# Patient Record
Sex: Male | Born: 1955 | Race: White | Hispanic: No | Marital: Single | Smoking: Former smoker
Health system: Southern US, Community
[De-identification: ages and names within clinical notes are randomized; demographics above are authoritative.]

## PROBLEM LIST (undated history)

## (undated) DIAGNOSIS — E669 Obesity, unspecified: Secondary | ICD-10-CM

## (undated) DIAGNOSIS — I219 Acute myocardial infarction, unspecified: Secondary | ICD-10-CM

## (undated) DIAGNOSIS — I10 Essential (primary) hypertension: Secondary | ICD-10-CM

## (undated) DIAGNOSIS — I509 Heart failure, unspecified: Secondary | ICD-10-CM

## (undated) DIAGNOSIS — B1920 Unspecified viral hepatitis C without hepatic coma: Secondary | ICD-10-CM

## (undated) DIAGNOSIS — I2510 Atherosclerotic heart disease of native coronary artery without angina pectoris: Secondary | ICD-10-CM

## (undated) DIAGNOSIS — E785 Hyperlipidemia, unspecified: Secondary | ICD-10-CM

## (undated) HISTORY — PX: HERNIA REPAIR: SHX51

## (undated) HISTORY — PX: LUMBAR LAMINECTOMY: SHX95

## (undated) HISTORY — PX: APPENDECTOMY: SHX54

## (undated) HISTORY — PX: CERVICAL LAMINECTOMY: SHX94

## (undated) HISTORY — PX: CHOLECYSTECTOMY: SHX55

## (undated) HISTORY — PX: REPLACEMENT TOTAL KNEE: SUR1224

---

## 2007-03-16 HISTORY — PX: CORONARY ARTERY BYPASS GRAFT: SHX141

## 2013-03-15 HISTORY — PX: CORONARY STENT PLACEMENT: SHX1402

## 2015-12-20 ENCOUNTER — Encounter (HOSPITAL_COMMUNITY): Payer: Self-pay

## 2015-12-20 ENCOUNTER — Inpatient Hospital Stay (HOSPITAL_COMMUNITY): Admission: EM | Admit: 2015-12-20 | Discharge: 2015-12-23 | DRG: 287 | Attending: Cardiology | Admitting: Cardiology

## 2015-12-20 ENCOUNTER — Emergency Department (HOSPITAL_COMMUNITY)

## 2015-12-20 DIAGNOSIS — Z23 Encounter for immunization: Secondary | ICD-10-CM

## 2015-12-20 DIAGNOSIS — I200 Unstable angina: Secondary | ICD-10-CM | POA: Diagnosis present

## 2015-12-20 DIAGNOSIS — I25110 Atherosclerotic heart disease of native coronary artery with unstable angina pectoris: Secondary | ICD-10-CM | POA: Diagnosis present

## 2015-12-20 DIAGNOSIS — Z6837 Body mass index (BMI) 37.0-37.9, adult: Secondary | ICD-10-CM | POA: Diagnosis not present

## 2015-12-20 DIAGNOSIS — Z87891 Personal history of nicotine dependence: Secondary | ICD-10-CM

## 2015-12-20 DIAGNOSIS — B1920 Unspecified viral hepatitis C without hepatic coma: Secondary | ICD-10-CM | POA: Diagnosis not present

## 2015-12-20 DIAGNOSIS — I2089 Other forms of angina pectoris: Secondary | ICD-10-CM | POA: Diagnosis present

## 2015-12-20 DIAGNOSIS — R61 Generalized hyperhidrosis: Secondary | ICD-10-CM | POA: Diagnosis present

## 2015-12-20 DIAGNOSIS — I252 Old myocardial infarction: Secondary | ICD-10-CM | POA: Diagnosis not present

## 2015-12-20 DIAGNOSIS — I10 Essential (primary) hypertension: Secondary | ICD-10-CM | POA: Diagnosis present

## 2015-12-20 DIAGNOSIS — Z7902 Long term (current) use of antithrombotics/antiplatelets: Secondary | ICD-10-CM

## 2015-12-20 DIAGNOSIS — M545 Low back pain: Secondary | ICD-10-CM | POA: Diagnosis present

## 2015-12-20 DIAGNOSIS — I208 Other forms of angina pectoris: Secondary | ICD-10-CM | POA: Diagnosis present

## 2015-12-20 DIAGNOSIS — Z9049 Acquired absence of other specified parts of digestive tract: Secondary | ICD-10-CM | POA: Diagnosis not present

## 2015-12-20 DIAGNOSIS — E785 Hyperlipidemia, unspecified: Secondary | ICD-10-CM | POA: Diagnosis not present

## 2015-12-20 DIAGNOSIS — I110 Hypertensive heart disease with heart failure: Secondary | ICD-10-CM | POA: Diagnosis present

## 2015-12-20 DIAGNOSIS — I509 Heart failure, unspecified: Secondary | ICD-10-CM

## 2015-12-20 DIAGNOSIS — Z96651 Presence of right artificial knee joint: Secondary | ICD-10-CM | POA: Diagnosis present

## 2015-12-20 DIAGNOSIS — I2510 Atherosclerotic heart disease of native coronary artery without angina pectoris: Secondary | ICD-10-CM | POA: Diagnosis present

## 2015-12-20 DIAGNOSIS — Z951 Presence of aortocoronary bypass graft: Secondary | ICD-10-CM | POA: Diagnosis not present

## 2015-12-20 DIAGNOSIS — E669 Obesity, unspecified: Secondary | ICD-10-CM | POA: Diagnosis not present

## 2015-12-20 DIAGNOSIS — Z7982 Long term (current) use of aspirin: Secondary | ICD-10-CM | POA: Diagnosis not present

## 2015-12-20 HISTORY — DX: Essential (primary) hypertension: I10

## 2015-12-20 HISTORY — DX: Acute myocardial infarction, unspecified: I21.9

## 2015-12-20 HISTORY — DX: Obesity, unspecified: E66.9

## 2015-12-20 HISTORY — DX: Heart failure, unspecified: I50.9

## 2015-12-20 HISTORY — DX: Hyperlipidemia, unspecified: E78.5

## 2015-12-20 HISTORY — DX: Atherosclerotic heart disease of native coronary artery without angina pectoris: I25.10

## 2015-12-20 HISTORY — DX: Unspecified viral hepatitis C without hepatic coma: B19.20

## 2015-12-20 LAB — HEPATIC FUNCTION PANEL
ALK PHOS: 82 U/L (ref 38–126)
ALT: 30 U/L (ref 17–63)
AST: 25 U/L (ref 15–41)
Albumin: 4 g/dL (ref 3.5–5.0)
BILIRUBIN TOTAL: 0.4 mg/dL (ref 0.3–1.2)
Total Protein: 7.6 g/dL (ref 6.5–8.1)

## 2015-12-20 LAB — CBC WITH DIFFERENTIAL/PLATELET
BASOS ABS: 0.1 10*3/uL (ref 0.0–0.1)
BASOS PCT: 1 %
EOS PCT: 6 %
Eosinophils Absolute: 0.5 10*3/uL (ref 0.0–0.7)
HCT: 42.2 % (ref 39.0–52.0)
Hemoglobin: 14.3 g/dL (ref 13.0–17.0)
Lymphocytes Relative: 17 %
Lymphs Abs: 1.6 10*3/uL (ref 0.7–4.0)
MCH: 31.6 pg (ref 26.0–34.0)
MCHC: 33.9 g/dL (ref 30.0–36.0)
MCV: 93.4 fL (ref 78.0–100.0)
MONO ABS: 1.2 10*3/uL — AB (ref 0.1–1.0)
Monocytes Relative: 13 %
Neutro Abs: 6.1 10*3/uL (ref 1.7–7.7)
Neutrophils Relative %: 63 %
PLATELETS: 187 10*3/uL (ref 150–400)
RBC: 4.52 MIL/uL (ref 4.22–5.81)
RDW: 13.5 % (ref 11.5–15.5)
WBC: 9.5 10*3/uL (ref 4.0–10.5)

## 2015-12-20 LAB — I-STAT TROPONIN, ED: Troponin i, poc: 0 ng/mL (ref 0.00–0.08)

## 2015-12-20 LAB — BASIC METABOLIC PANEL
Anion gap: 6 (ref 5–15)
BUN: 17 mg/dL (ref 6–20)
CALCIUM: 9.2 mg/dL (ref 8.9–10.3)
CO2: 23 mmol/L (ref 22–32)
CREATININE: 1.13 mg/dL (ref 0.61–1.24)
Chloride: 107 mmol/L (ref 101–111)
GFR calc Af Amer: >60 mL/min (ref >60)
GFR calc non Af Amer: >60 mL/min (ref >60)
GLUCOSE: 100 mg/dL — AB (ref 65–99)
Potassium: 3.9 mmol/L (ref 3.5–5.1)
Sodium: 136 mmol/L (ref 135–145)

## 2015-12-20 LAB — TROPONIN I

## 2015-12-20 MED ORDER — ACETAMINOPHEN 325 MG PO TABS
650.0000 mg | ORAL_TABLET | ORAL | Status: DC | PRN
  Administered 2015-12-20: 650 mg via ORAL
  Filled 2015-12-20: qty 2

## 2015-12-20 MED ORDER — ATORVASTATIN CALCIUM 40 MG PO TABS
40.0000 mg | ORAL_TABLET | Freq: Every day | ORAL | Status: DC
  Administered 2015-12-21 – 2015-12-23 (×3): 40 mg via ORAL
  Filled 2015-12-20 (×3): qty 1

## 2015-12-20 MED ORDER — MORPHINE SULFATE (PF) 4 MG/ML IV SOLN
4.0000 mg | Freq: Once | INTRAVENOUS | Status: AC
Start: 2015-12-20 — End: 2015-12-20
  Administered 2015-12-20: 4 mg via INTRAVENOUS
  Filled 2015-12-20: qty 1

## 2015-12-20 MED ORDER — MORPHINE SULFATE (PF) 4 MG/ML IV SOLN
4.0000 mg | Freq: Once | INTRAVENOUS | Status: AC
  Administered 2015-12-20: 4 mg via INTRAVENOUS
  Filled 2015-12-20: qty 1

## 2015-12-20 MED ORDER — NITROGLYCERIN IN D5W 200-5 MCG/ML-% IV SOLN
5.0000 ug/min | INTRAVENOUS | Status: DC
  Administered 2015-12-20: 5 ug/min via INTRAVENOUS
  Filled 2015-12-20: qty 250

## 2015-12-20 MED ORDER — NITROGLYCERIN 0.4 MG SL SUBL: 0.4000 mg | SUBLINGUAL_TABLET | SUBLINGUAL | Status: DC | PRN

## 2015-12-20 MED ORDER — HEPARIN BOLUS VIA INFUSION
4000.0000 [IU] | Freq: Once | INTRAVENOUS | Status: AC
  Administered 2015-12-20: 4000 [IU] via INTRAVENOUS
  Filled 2015-12-20: qty 4000

## 2015-12-20 MED ORDER — INFLUENZA VAC SPLIT QUAD 0.5 ML IM SUSY
0.5000 mL | PREFILLED_SYRINGE | INTRAMUSCULAR | Status: AC
  Administered 2015-12-22: 0.5 mL via INTRAMUSCULAR

## 2015-12-20 MED ORDER — ASPIRIN EC 81 MG PO TBEC
81.0000 mg | DELAYED_RELEASE_TABLET | Freq: Every day | ORAL | Status: DC
  Administered 2015-12-21 – 2015-12-22 (×2): 81 mg via ORAL
  Filled 2015-12-20 (×2): qty 1

## 2015-12-20 MED ORDER — CARVEDILOL 12.5 MG PO TABS
12.5000 mg | ORAL_TABLET | Freq: Two times a day (BID) | ORAL | Status: DC
  Administered 2015-12-20 – 2015-12-23 (×6): 12.5 mg via ORAL
  Filled 2015-12-20 (×6): qty 1

## 2015-12-20 MED ORDER — ASPIRIN 325 MG PO TABS: 325.0000 mg | ORAL_TABLET | Freq: Once | ORAL | Status: DC

## 2015-12-20 MED ORDER — NITROGLYCERIN 2 % TD OINT
1.0000 [in_us] | TOPICAL_OINTMENT | Freq: Once | TRANSDERMAL | Status: AC
  Administered 2015-12-20: 1 [in_us] via TOPICAL
  Filled 2015-12-20: qty 1

## 2015-12-20 MED ORDER — HEPARIN (PORCINE) IN NACL 100-0.45 UNIT/ML-% IJ SOLN
1200.0000 [IU]/h | INTRAMUSCULAR | Status: DC
  Administered 2015-12-20 – 2015-12-21 (×2): 1200 [IU]/h via INTRAVENOUS
  Filled 2015-12-20 (×3): qty 250

## 2015-12-20 MED ORDER — ONDANSETRON HCL 4 MG/2ML IJ SOLN: 4.0000 mg | Freq: Four times a day (QID) | INTRAMUSCULAR | Status: DC | PRN

## 2015-12-20 MED ORDER — RANOLAZINE ER 500 MG PO TB12
1000.0000 mg | ORAL_TABLET | Freq: Two times a day (BID) | ORAL | Status: DC
  Administered 2015-12-20 – 2015-12-23 (×6): 1000 mg via ORAL
  Filled 2015-12-20 (×6): qty 2

## 2015-12-20 MED ORDER — OXYCODONE-ACETAMINOPHEN 5-325 MG PO TABS
1.0000 | ORAL_TABLET | Freq: Once | ORAL | Status: AC
  Administered 2015-12-20: 1 via ORAL
  Filled 2015-12-20: qty 1

## 2015-12-20 MED ORDER — ASPIRIN EC 81 MG PO TBEC: 81.0000 mg | DELAYED_RELEASE_TABLET | Freq: Every day | ORAL | Status: DC

## 2015-12-20 MED ORDER — LISINOPRIL 10 MG PO TABS
10.0000 mg | ORAL_TABLET | Freq: Every day | ORAL | Status: DC
  Administered 2015-12-22 – 2015-12-23 (×2): 10 mg via ORAL
  Filled 2015-12-20 (×3): qty 1

## 2015-12-20 MED ORDER — MORPHINE SULFATE (PF) 2 MG/ML IV SOLN
2.0000 mg | INTRAVENOUS | Status: DC | PRN
  Administered 2015-12-20 – 2015-12-22 (×12): 2 mg via INTRAVENOUS
  Filled 2015-12-20 (×13): qty 1

## 2015-12-20 MED ORDER — NITROGLYCERIN IN D5W 200-5 MCG/ML-% IV SOLN: 0.0000 ug/min | INTRAVENOUS | Status: DC

## 2015-12-20 MED ORDER — AMLODIPINE BESYLATE 10 MG PO TABS
10.0000 mg | ORAL_TABLET | Freq: Every day | ORAL | Status: DC
  Administered 2015-12-22 – 2015-12-23 (×2): 10 mg via ORAL
  Filled 2015-12-20 (×3): qty 1

## 2015-12-20 MED ORDER — CLOPIDOGREL BISULFATE 75 MG PO TABS
75.0000 mg | ORAL_TABLET | Freq: Every day | ORAL | Status: DC
  Administered 2015-12-21 – 2015-12-23 (×3): 75 mg via ORAL
  Filled 2015-12-20 (×3): qty 1

## 2015-12-20 NOTE — ED Notes (Addendum)
Pt reports that pain is increasing to 8 and described as sharp. Dr Estell HarpinZammit notifed

## 2015-12-20 NOTE — ED Notes (Signed)
Pt complaining of increase chest pain rates pain 10. Per Dr Estell HarpinZammit may have Morphine 4mg  IV

## 2015-12-20 NOTE — ED Notes (Signed)
Pt reports that pain is increasing in chest again. Dr zammitt notified

## 2015-12-20 NOTE — ED Provider Notes (Signed)
[facility]-EM[facility]ENCY DEPT Provider Note   CSN: [id number] Arrival date & time: 12/20/15  [id number]  By signing my name below, I, Majel HomerPeyton Lee, attest that this documentation has been prepared under the direction and in the presence of Bethann BerkshireJoseph Ciaira Natividad, MD . Electronically Signed: Majel HomerPeyton Lee, Scribe. 12/20/2015. 8:13 AM.  History   Chief Complaint Chief Complaint  Patient presents with  . Chest Pain   The history is provided by the patient. No language interpreter was used.  Chest Pain   This is a new problem. The current episode started 1 to 2 hours ago. The problem has been gradually worsening. The pain is at a severity of 10/10. Associated symptoms include diaphoresis and shortness of breath. Pertinent negatives include no abdominal pain, no back pain, no cough and no headaches. He has tried nitroglycerin for the symptoms.  His past medical history is significant for CAD, CHF and MI.  Pertinent negatives for past medical history include no seizures.  Procedure history is positive for cardiac catheterization.   HPI Comments: Reginald Randolph is a 60 y.o. male with PMHx of CHF and CAD,  who presents to the Emergency Department complaining of gradually worsening, 10/10, chest pain that began at ~6:30 AM this morning. Pt reports associated diaphoresis and shortness of breath. He notes he has taken 5 NTG and 324 mg ASA with no relief. He reports hx of 2 MI and states his last cardiac catheterization was in 2015. Pt states he is currently taking 5 mg Plavix.   Past Medical History:  Diagnosis Date  . CHF (congestive heart failure) (HCC)   . Coronary artery disease   . Hepatitis C   . Hypertension   . Myocardial infarct x 2   There are no active problems to display for this patient.  Past Surgical History:  Procedure Laterality Date  . CORONARY ARTERY BYPASS GRAFT  2009  . CORONARY STENT PLACEMENT  2015    Home Medications    Prior to Admission medications   Not on File    Family History No  family history on file.  Social History Social History  Substance Use Topics  . Smoking status: Former Smoker    Quit date: 12/19/2013  . Smokeless tobacco: Never Used  . Alcohol use No     Allergies   Review of patient's allergies indicates no known allergies.   Review of Systems Review of Systems  Constitutional: Positive for diaphoresis. Negative for appetite change and fatigue.  HENT: Negative for congestion, ear discharge and sinus pressure.   Eyes: Negative for discharge.  Respiratory: Positive for shortness of breath. Negative for cough.   Cardiovascular: Positive for chest pain.  Gastrointestinal: Negative for abdominal pain and diarrhea.  Genitourinary: Negative for frequency and hematuria.  Musculoskeletal: Negative for back pain.  Skin: Negative for rash.  Neurological: Negative for seizures and headaches.  Psychiatric/Behavioral: Negative for hallucinations.   Physical Exam Updated Vital Signs BP 117/79 (BP Location: Right Arm) Comment: Simultaneous filing. User may not have seen previous data.  Pulse 70 Comment: Simultaneous filing. User may not have seen previous data.  Temp 97.5 F (36.4 C) (Oral)   Resp 16 Comment: Simultaneous filing. User may not have seen previous data.  Ht 5\' 4"  (1.626 m)   Wt 225 lb (102.1 kg)   SpO2 94% Comment: Simultaneous filing. User may not have seen previous data.  BMI 38.62 kg/m   Physical Exam  Constitutional: He is oriented to person, place, and time. He appears well-developed.  HENT:  Head: Normocephalic.  Eyes: Conjunctivae and EOM are normal. No scleral icterus.  Neck: Neck supple. No thyromegaly present.  Cardiovascular: Normal rate and regular rhythm.  Exam reveals no gallop and no friction rub.   No murmur heard. Pulmonary/Chest: No stridor. He has no wheezes. He has no rales. He exhibits no tenderness.  Abdominal: He exhibits no distension. There is no tenderness. There is no rebound.  Musculoskeletal: Normal  range of motion. He exhibits no edema.  Lymphadenopathy:    He has no cervical adenopathy.  Neurological: He is oriented to person, place, and time. He exhibits normal muscle tone. Coordination normal.  Skin: No rash noted. No erythema.  Psychiatric: He has a normal mood and affect. His behavior is normal.   ED Treatments / Results  Labs (all labs ordered are listed, but only abnormal results are displayed) Labs Reviewed  BASIC METABOLIC PANEL  CBC  I-STAT TROPOININ, ED    EKG  EKG Interpretation  Date/Time:  Saturday December 20 2015 08:00:17 EDT Ventricular Rate:  70 PR Interval:    QRS Duration: 107 QT Interval:  426 QTC Calculation: 460 R Axis:   97 Text Interpretation:  Sinus rhythm Right axis deviation Confirmed by Eron Staat  MD, Alaiah Lundy [id number]) on 12/20/2015 8:07:14 AM       Radiology Dg Chest 2 View  Result Date: 12/20/2015 CLINICAL DATA:  Shortness of breath and chest pain EXAM: CHEST  2 VIEW COMPARISON:  None. FINDINGS: There is minimal scarring in the left base. There is no edema or consolidation. The heart size and pulmonary vascularity are normal. Patient is status post coronary artery bypass grafting. Native coronary artery calcification noted. There is atherosclerotic calcification in the aorta. There is a thoracic stimulator with tips in the mid thoracic region. There is postoperative change in the lower cervical spine. There are foci of carotid artery calcification bilaterally. There is no evident adenopathy. IMPRESSION: Slight scarring left base. No edema or consolidation. Aortic atherosclerosis. Calcification in both carotid arteries. Heart size within normal limits. Electronically Signed   By: Bretta Bang III M.D.   On: 12/20/2015 08:24   Procedures Procedures (including critical care time)  Medications Ordered in ED Medications - No data to display  DIAGNOSTIC STUDIES:  Oxygen Saturation is 94% on RA, normal by my interpretation.    COORDINATION OF  CARE:  8:10 AM Discussed treatment plan with pt at bedside and pt agreed to plan.  Initial Impression / Assessment and Plan / ED Course  I have reviewed the triage vital signs and the nursing notes.  Pertinent labs & imaging results that were available during my care of the patient were reviewed by me and considered in my medical decision making (see chart for details).  Clinical Course  CRITICAL CARE Performed by: Lynnzie Blackson L Total critical care time: 40 minutes Critical care time was exclusive of separately billable procedures and treating other patients. Critical care was necessary to treat or prevent imminent or life-threatening deterioration. Critical care was time spent personally by me on the following activities: development of treatment plan with patient and/or surrogate as well as nursing, discussions with consultants, evaluation of patient's response to treatment, examination of patient, obtaining history from patient or surrogate, ordering and performing treatments and interventions, ordering and review of laboratory studies, ordering and review of radiographic studies, pulse oximetry and re-evaluation of patient's condition.    I personally performed the services described in this documentation, which was scribed in my presence. The recorded information  has been reviewed and is accurate.   Final Clinical Impressions(s) / ED Diagnoses   Final diagnoses:  None  Patient with unstable angina. He is put on a nitroglycerin drip and is being transferred to [facility]. Dr. Delton See is the accepting physician  New Prescriptions New Prescriptions   No medications on file     Bethann Berkshire, MD 12/20/15 1242

## 2015-12-20 NOTE — Progress Notes (Signed)
ANTICOAGULATION CONSULT NOTE - Initial Consult  Pharmacy Consult for Heparin Indication: chest pain/ACS  No Known Allergies  Patient Measurements: Height: 5\' 4"  (162.6 cm) Weight: 219 lb 1.6 oz (99.4 kg) IBW/kg (Calculated) : 59.2 Heparin Dosing Weight: 81.5kg  Vital Signs: Temp: 97.8 F (36.6 C) (10/07 1439) Temp Source: Oral (10/07 1439) BP: 102/71 (10/07 1439) Pulse Rate: 63 (10/07 1439)  Labs:  Recent Labs  12/20/15 0901  HGB 14.3  HCT 42.2  PLT 187  CREATININE 1.13    Estimated Creatinine Clearance: 73.1 mL/min (by C-G formula based on SCr of 1.13 mg/dL).   Medical History: Past Medical History:  Diagnosis Date  . CHF (congestive heart failure) (HCC)   . Coronary artery disease   . Hepatitis C   . Hyperlipidemia   . Hypertension   . Myocardial infarct x 2  . Obesity (BMI 30-39.9)     Medications:  No anticoagulants pta  Assessment: 60yom with hx CABG and multiple stents to begin heparin for chest pain with plan for cath. Baseline labs wnl.  Goal of Therapy:  Heparin level 0.3-0.7 units/ml Monitor platelets by anticoagulation protocol: Yes   Plan:  1) Heparin bolus 4000 units x 1 2) Heparin drip at 1200 units/hr 3) 6 hour heparin level 4) Daily heparin level and CBC  Fredrik RiggerMarkle, Arlis Everly Sue 12/20/2015,3:51 PM

## 2015-12-20 NOTE — H&P (Addendum)
History and Physical   Admit date: 12/20/2015 Name:  Reginald Randolph Medical record number: [id number] DOB/Age:  Mar 07, 1956  60 y.o. male  [facility]  [facility]  Chief complaint/reason for admission: Chest pain  HPI:  This 60 year old male comes in [facility] with chest discomfort.  The patient has a prior cardiac history having had bypass grafting in [facility] at [facility] in [place] when he was in prison at that time.  He evidently had bypass grafting 3.  He had a myocardial infarction and had stents placed in 2015 when in [place].  He was incarcerated a second time and evidently had a catheterization about a year and a half ago at [facility] in [place] and was told that he had some small blockages that needed to be treated medically.  He has been incarcerated a third time and while sitting at the table eating breakfast point had the onset of midsternal chest pain associated with shortness of breath and sweating with some sharp pain in his left chest area.  He was transported to the emergency room and is now admitted to the floor.  He is having some ongoing chest pain he describes as sharp.  Initial troponin is negative.  His EKG was nonischemic.  He has a history of severe low back pain and uses a neurostimulator.  He is currently disabled.  He is obese.  He is currently a nonsmoker.  He normally does not have anginal type pain.  [place] [facility] [place] [facility] [place] but he also sees a doctor in [place] at times where he lives.  Denies PND, orthopnea or edema.  He carries a diagnosis of congestive heart failure and had an echocardiogram at some point and was told his heart was 55%.    Past Medical History:  Diagnosis Date  . CHF (congestive heart failure) (HCC)   . Coronary artery disease   . Hepatitis C   . Hyperlipidemia   . Hypertension   . Myocardial  infarct x 2  . Obesity (BMI 30-39.9)      Past Surgical History:  Procedure Laterality Date  . APPENDECTOMY    . CERVICAL LAMINECTOMY    . CHOLECYSTECTOMY    . CORONARY ARTERY BYPASS GRAFT  2009  . CORONARY STENT PLACEMENT  2015  . HERNIA REPAIR    . LUMBAR LAMINECTOMY    . REPLACEMENT TOTAL KNEE Right    Allergies: has No Known Allergies.   Medications: Prior to Admission medications   Medication Sig Start Date End Date Taking? Authorizing Provider  amLODipine (NORVASC) 10 MG tablet Take 10 mg by mouth daily.   Yes Historical Provider, MD  aspirin EC 81 MG tablet Take 81 mg by mouth daily.   Yes Historical Provider, MD  atorvastatin (LIPITOR) 40 MG tablet Take 40 mg by mouth daily.   Yes Historical Provider, MD  carvedilol (COREG) 12.5 MG tablet Take 12.5 mg by mouth 2 (two) times daily with a meal.   Yes Historical Provider, MD  clopidogrel (PLAVIX) 75 MG tablet Take 75 mg by mouth daily.   Yes Historical Provider, MD  lisinopril (PRINIVIL,ZESTRIL) 10 MG tablet Take 10 mg by mouth daily.   Yes Historical Provider, MD  ranolazine (RANEXA) 500 MG 12 hr tablet Take 1,000 mg by mouth 2 (two) times daily.   Yes Historical Provider, MD    Family History:  Family Status  Relation Status  . Mother Alive  . Father Deceased  . Brother Deceased    Social History:   reports that he quit smoking about 2 years ago. He has a 40.00 pack-year smoking history. He has never used smokeless tobacco. He reports that he does not drink alcohol or use drugs.  Drank heavily when he was in the [employer] but none recently.   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  He has been incarcerated 3 times for forgery the past few years.  He is currently in [facility] institution.  Still married for about 30 years.  Has a son and a daughter.    Review of Systems:  He has chronic back pain and has a neurostimulator in place.  He is been obese.  He has mild shortness of breath.  He  denies abdominal pain.  Does have a history of hepatitis and received treatment for that.  Other than as noted above remainder of the review of systems is unremarkable. Other than as noted above, the remainder of the review of systems is normal  Physical Exam: BP 102/71 (BP Location: Left Arm)   Pulse 63   Temp 97.8 F (36.6 C) (Oral)   Resp 18   Ht 5\' 4"  (1.626 m)   Wt 99.4 kg (219 lb 1.6 oz)   SpO2 96%   BMI 37.61 kg/m  General appearance: He is a middle-aged male who is moderately obese bearded and in no acute distress Head: Normocephalic, without obvious abnormality, atraumatic Eyes: conjunctivae/corneas clear. PERRL, EOM's intact. Fundi not examined  Neck: no adenopathy, no carotid bruit, no JVD and supple, symmetrical, trachea midline Lungs: clear to auscultation bilaterally, healed median sternotomy scar Heart: regular rate and rhythm, S1, S2 normal, no murmur, click, rub or gallop Abdomen: soft, non-tender; bowel sounds normal; no masses,  no organomegaly Rectal: deferred Extremities: extremities normal, atraumatic, no cyanosis or edema, previous scars knee replacement on the right knee, saphenous vein harvesting scars noted left leg Pulses: 2+ and symmetric Skin: Skin color, texture, turgor normal. No rashes or lesions Neurologic: Grossly normal   Labs: CBC  Recent Labs  12/20/15 0901  WBC 9.5  RBC 4.52  HGB 14.3  HCT 42.2  PLT 187  MCV 93.4  MCH 31.6  MCHC 33.9  RDW 13.5  LYMPHSABS 1.6  MONOABS 1.2*  EOSABS 0.5  BASOSABS 0.1   CMP   Recent Labs  12/20/15 0901  NA 136  K 3.9  CL 107  CO2 23  GLUCOSE 100*  BUN 17  CREATININE 1.13  CALCIUM 9.2  PROT 7.6  ALBUMIN 4.0  AST 25  ALT 30  ALKPHOS 82  BILITOT 0.4  GFRNONAA >60  GFRAA >60    BNP (last 3 results) No results for input(s): BNP in the last 8760 hours.  ProBNP (last 3 results) No results for input(s): PROBNP in the last 8760 hours.  Cardiac Panel (last 3 results) Troponin (Point  of Care Test)  Recent Labs  12/20/15 0858  TROPIPOC 0.00   EKG: Sinus rhythm, no acute changes.  Radiology: Scarring left base, normal heart size    IMPRESSIONS: 1.  Chest pain suggestive of myocardial ischemia in a patient with previous stenting and coronary artery bypass grafting 2.  CAD 3.  Obesity 4.  Hypertension 5.  Hyperlipidemia 6.  History of hepatitis C 7.  History of congestive heart failure 8.  Incarceration  PLAN: Intravenous heparin and nitroglycerin, check serial troponins.  Morphine  as needed for pain.  Likely will need repeat catheterization.  Attempt to obtain records of previous catheterizations and interventions that have been done at various hospitals around the state.  Currently because of privacy issues not able to do that through Epic.    Signed: Darden Palmer MD [facility] Cardiology  12/20/2015, 3:22 PM

## 2015-12-20 NOTE — ED Notes (Signed)
EDP at bedside  

## 2015-12-20 NOTE — ED Notes (Signed)
Report given to Justin with carelink  

## 2015-12-20 NOTE — ED Triage Notes (Addendum)
Pt came in via EMS from prison. Left sided chest pain, jaw pain and arm pain that started 0630. Has taken nitro x 5 and asa 324 mg with some relief. Has Hx of 2 MI and stents in 2015. States he was reading prior to pain starting

## 2015-12-21 LAB — CBC
HCT: 42.2 % (ref 39.0–52.0)
Hemoglobin: 13.8 g/dL (ref 13.0–17.0)
MCH: 30.9 pg (ref 26.0–34.0)
MCHC: 32.7 g/dL (ref 30.0–36.0)
MCV: 94.6 fL (ref 78.0–100.0)
PLATELETS: 190 10*3/uL (ref 150–400)
RBC: 4.46 MIL/uL (ref 4.22–5.81)
RDW: 13.6 % (ref 11.5–15.5)
WBC: 9.4 10*3/uL (ref 4.0–10.5)

## 2015-12-21 LAB — LIPID PANEL
CHOLESTEROL: 112 mg/dL (ref 0–200)
HDL: 38 mg/dL — ABNORMAL LOW (ref >40)
LDL Cholesterol: 54 mg/dL (ref 0–99)
TRIGLYCERIDES: 101 mg/dL (ref <150)
Total CHOL/HDL Ratio: 2.9 RATIO
VLDL: 20 mg/dL (ref 0–40)

## 2015-12-21 LAB — MRSA PCR SCREENING: MRSA by PCR: NEGATIVE

## 2015-12-21 LAB — HEPARIN LEVEL (UNFRACTIONATED)
Heparin Unfractionated: 0.53 IU/mL (ref 0.30–0.70)
Heparin Unfractionated: 0.6 IU/mL (ref 0.30–0.70)

## 2015-12-21 LAB — TROPONIN I: Troponin I: <0.03 ng/mL (ref <0.03)

## 2015-12-21 MED ORDER — SODIUM CHLORIDE 0.9 % WEIGHT BASED INFUSION
1.0000 mL/kg/h | INTRAVENOUS | Status: DC
  Administered 2015-12-22: 1 mL/kg/h via INTRAVENOUS

## 2015-12-21 MED ORDER — SODIUM CHLORIDE 0.9% FLUSH: 3.0000 mL | INTRAVENOUS | Status: DC | PRN

## 2015-12-21 MED ORDER — SODIUM CHLORIDE 0.9 % WEIGHT BASED INFUSION
3.0000 mL/kg/h | INTRAVENOUS | Status: DC
  Administered 2015-12-22: 3 mL/kg/h via INTRAVENOUS

## 2015-12-21 MED ORDER — SODIUM CHLORIDE 0.9 % IV SOLN: 250.0000 mL | INTRAVENOUS | Status: DC | PRN

## 2015-12-21 MED ORDER — ASPIRIN 81 MG PO CHEW
81.0000 mg | CHEWABLE_TABLET | ORAL | Status: AC
  Administered 2015-12-22: 81 mg via ORAL
  Filled 2015-12-21: qty 1

## 2015-12-21 MED ORDER — SODIUM CHLORIDE 0.9% FLUSH
3.0000 mL | Freq: Two times a day (BID) | INTRAVENOUS | Status: DC
  Administered 2015-12-21 – 2015-12-22 (×2): 3 mL via INTRAVENOUS

## 2015-12-21 NOTE — Progress Notes (Signed)
ANTICOAGULATION CONSULT NOTE - Follow-up Consult  Pharmacy Consult for Heparin Indication: chest pain/ACS  No Known Allergies  Patient Measurements: Height: 5\' 4"  (162.6 cm) Weight: 219 lb 1.6 oz (99.4 kg) IBW/kg (Calculated) : 59.2 Heparin Dosing Weight: 81.5kg  Vital Signs: Temp: 97.9 F (36.6 C) (10/07 2010) Temp Source: Oral (10/07 2010) BP: 131/69 (10/07 1559) Pulse Rate: 64 (10/07 2010)  Labs:  Recent Labs  12/20/15 0901 12/20/15 1542 12/20/15 2040 12/21/15 0002  HGB 14.3  --   --   --   HCT 42.2  --   --   --   PLT 187  --   --   --   HEPARINUNFRC  --   --   --  0.53  CREATININE 1.13  --   --   --   TROPONINI  --  <0.03 <0.03  --     Estimated Creatinine Clearance: 73.1 mL/min (by C-G formula based on SCr of 1.13 mg/dL).   Assessment: 60yom with hx CABG and multiple stents to begin heparin for chest pain with plan for cath. Heparin level therapeutic (0.53) on gtt at 1200 units/hr. No bleeding noted.  Goal of Therapy:  Heparin level 0.3-0.7 units/ml Monitor platelets by anticoagulation protocol: Yes   Plan:  Continue heparin drip at 1200 units/hr F/u 6hr confirmatory heparin level  Christoper Fabianaron Brinly Maietta, PharmD, BCPS Clinical pharmacist, pager [phone] 12/21/2015,1:15 AM

## 2015-12-21 NOTE — Progress Notes (Signed)
ANTICOAGULATION CONSULT NOTE - Follow-up Consult  Pharmacy Consult for Heparin Indication: chest pain/ACS  No Known Allergies  Patient Measurements: Height: 5\' 4"  (162.6 cm) Weight: 219 lb 1.6 oz (99.4 kg) IBW/kg (Calculated) : 59.2 Heparin Dosing Weight: 81.5kg  Vital Signs: BP: 105/70 (10/08 0303)  Labs:  Recent Labs  12/20/15 0901 12/20/15 1542 12/20/15 2040 12/21/15 0002 12/21/15 0351 12/21/15 0625  HGB 14.3  --   --   --  13.8  --   HCT 42.2  --   --   --  42.2  --   PLT 187  --   --   --  190  --   HEPARINUNFRC  --   --   --  0.53  --  0.60  CREATININE 1.13  --   --   --   --   --   TROPONINI  --  <0.03 <0.03  --  <0.03  --     Estimated Creatinine Clearance: 73.1 mL/min (by C-G formula based on SCr of 1.13 mg/dL).   Assessment: 60yom with hx CABG and multiple stents, on IV heparin for chest pain. Troponin neg x 3. Heparin level therapeutic (0.6) on gtt at 1200 units/hr. CBC stable, no bleeding noted.  Goal of Therapy:  Heparin level 0.3-0.7 units/ml Monitor platelets by anticoagulation protocol: Yes   Plan:  Continue heparin drip at 1200 units/hr F/u AM labs  Bayard HuggerMei Verlyn Lambert, PharmD, BCPS  Clinical Pharmacist  Pager: [phone]   12/21/2015,9:48 AM

## 2015-12-21 NOTE — Progress Notes (Signed)
Subjective:  Patient resting comfortably in the bed this morning.  Had recurrent chest pain earlier today that has resolved.  Review of records show that he had bypass [place] in [place] with a mammary to the LAD and vein graft to the right coronary artery and a vein graft to the circumflex.  We've not received records from [facility] in [place] where he says he had 2 stents.  The [facility] on March 2016 showed his grafts to be patent and he was reported to have a 70-80 percent stenosis in the distal LAD and at that point was placed on Ranexa.  Objective:  Vital Signs in the last 24 hours: BP (!) 95/54   Pulse 64   Temp 97.9 F (36.6 C) (Oral)   Resp 18   Ht 5\' 4"  (1.626 m)   Wt 99.4 kg (219 lb 1.6 oz)   SpO2 95%   BMI 37.61 kg/m   Physical Exam: Obese male in no acute distress currently Lungs:  Clear Cardiac:  Regular rhythm, normal S1 and S2, no S3 Abdomen:  Soft, nontender, no masses Extremities:  No edema present  Intake/Output from previous day: 10/07 0701 - 10/08 0700 In: 594.6 [P.O.:480; I.V.:114.6] Out: -   Weight Filed Weights   12/20/15 0800 12/20/15 1437  Weight: 102.1 kg (225 lb) 99.4 kg (219 lb 1.6 oz)    Lab Results: Basic Metabolic Panel:  Recent Labs  56/21/3008/09/28 0901  NA 136  K 3.9  CL 107  CO2 23  GLUCOSE 100*  BUN 17  CREATININE 1.13   CBC:  Recent Labs  12/20/15 0901 12/21/15 0351  WBC 9.5 9.4  NEUTROABS 6.1  --   HGB 14.3 13.8  HCT 42.2 42.2  MCV 93.4 94.6  PLT 187 190   Cardiac Enzymes: Troponin (Point of Care Test)  Recent Labs  12/20/15 0858  TROPIPOC 0.00   Cardiac Panel (last 3 results)  Recent Labs  12/20/15 1542 12/20/15 2040 12/21/15 0351  TROPONINI <0.03 <0.03 <0.03    Telemetry: Sinus rhythm  Assessment/Plan:  1.  Recurrent chest pain in a patient with distal LAD disease and previous bypass grafting and reported stenting although haven't verified by records  yet he is currently on heparin and nitroglycerin and is currently in prison   2.  CAD as noted above 3.  Hypertension  Recommendations:  I think he is going to need to have a repeat cath to relook at his coronaries to be sure he has not developed interval disease. Cardiac catheterization was discussed with the patient fully including risks of myocardial infarction, death, stroke, bleeding, arrhythmia, dye allergy, renal insufficiency or bleeding.  The patient understands and is willing to proceed.  Possibility of intervention at the same time also discussed with patient and they understand and are agreeable to proceed.  Continue heparin and IV nitroglycerin until cath.      Darden PalmerW. Spencer Tilley, Jr.  MD [facility]  12/21/2015, 10:10 AM

## 2015-12-22 ENCOUNTER — Encounter (HOSPITAL_COMMUNITY): Admission: EM | Payer: Self-pay | Source: Home / Self Care | Attending: Cardiology

## 2015-12-22 ENCOUNTER — Other Ambulatory Visit (HOSPITAL_COMMUNITY)

## 2015-12-22 DIAGNOSIS — I200 Unstable angina: Secondary | ICD-10-CM

## 2015-12-22 DIAGNOSIS — I25110 Atherosclerotic heart disease of native coronary artery with unstable angina pectoris: Principal | ICD-10-CM

## 2015-12-22 HISTORY — PX: CARDIAC CATHETERIZATION: SHX172

## 2015-12-22 LAB — POCT ACTIVATED CLOTTING TIME: ACTIVATED CLOTTING TIME: 98 s

## 2015-12-22 LAB — PROTIME-INR
INR: 0.99
Prothrombin Time: 13.1 seconds (ref 11.4–15.2)

## 2015-12-22 LAB — CBC
HEMATOCRIT: 41.7 % (ref 39.0–52.0)
HEMOGLOBIN: 13.6 g/dL (ref 13.0–17.0)
MCH: 30.9 pg (ref 26.0–34.0)
MCHC: 32.6 g/dL (ref 30.0–36.0)
MCV: 94.8 fL (ref 78.0–100.0)
PLATELETS: 193 10*3/uL (ref 150–400)
RBC: 4.4 MIL/uL (ref 4.22–5.81)
RDW: 13.5 % (ref 11.5–15.5)
WBC: 8.4 10*3/uL (ref 4.0–10.5)

## 2015-12-22 LAB — HEPARIN LEVEL (UNFRACTIONATED): Heparin Unfractionated: 0.41 IU/mL (ref 0.30–0.70)

## 2015-12-22 SURGERY — LEFT HEART CATH AND CORS/GRAFTS ANGIOGRAPHY

## 2015-12-22 MED ORDER — IOPAMIDOL (ISOVUE-370) INJECTION 76%
INTRAVENOUS | Status: AC
  Filled 2015-12-22: qty 125

## 2015-12-22 MED ORDER — ASPIRIN 81 MG PO CHEW
81.0000 mg | CHEWABLE_TABLET | Freq: Every day | ORAL | Status: DC
  Administered 2015-12-23: 81 mg via ORAL
  Filled 2015-12-22: qty 1

## 2015-12-22 MED ORDER — ONDANSETRON HCL 4 MG/2ML IJ SOLN: 4.0000 mg | Freq: Four times a day (QID) | INTRAMUSCULAR | Status: DC | PRN

## 2015-12-22 MED ORDER — SODIUM CHLORIDE 0.9% FLUSH
3.0000 mL | Freq: Two times a day (BID) | INTRAVENOUS | Status: DC
  Administered 2015-12-22 – 2015-12-23 (×2): 3 mL via INTRAVENOUS

## 2015-12-22 MED ORDER — FENTANYL CITRATE (PF) 100 MCG/2ML IJ SOLN
INTRAMUSCULAR | Status: DC | PRN
  Administered 2015-12-22: 50 ug via INTRAVENOUS

## 2015-12-22 MED ORDER — MIDAZOLAM HCL 2 MG/2ML IJ SOLN
INTRAMUSCULAR | Status: AC
  Filled 2015-12-22: qty 2

## 2015-12-22 MED ORDER — ACETAMINOPHEN 325 MG PO TABS: 650.0000 mg | ORAL_TABLET | ORAL | Status: DC | PRN

## 2015-12-22 MED ORDER — IOPAMIDOL (ISOVUE-370) INJECTION 76%
INTRAVENOUS | Status: DC | PRN
  Administered 2015-12-22: 75 mL via INTRA_ARTERIAL

## 2015-12-22 MED ORDER — LIDOCAINE HCL (PF) 1 % IJ SOLN
INTRAMUSCULAR | Status: DC | PRN
  Administered 2015-12-22: 20 mL
  Administered 2015-12-22: 8 mL

## 2015-12-22 MED ORDER — HEPARIN (PORCINE) IN NACL 2-0.9 UNIT/ML-% IJ SOLN
INTRAMUSCULAR | Status: AC
  Filled 2015-12-22: qty 1000

## 2015-12-22 MED ORDER — LIDOCAINE HCL (PF) 1 % IJ SOLN
INTRAMUSCULAR | Status: AC
  Filled 2015-12-22: qty 30

## 2015-12-22 MED ORDER — SODIUM CHLORIDE 0.9 % IV SOLN: 250.0000 mL | INTRAVENOUS | Status: DC | PRN

## 2015-12-22 MED ORDER — HEPARIN (PORCINE) IN NACL 2-0.9 UNIT/ML-% IJ SOLN
INTRAMUSCULAR | Status: DC | PRN
Start: 2015-12-22 — End: 2015-12-22
  Administered 2015-12-22: 1000 mL

## 2015-12-22 MED ORDER — MIDAZOLAM HCL 2 MG/2ML IJ SOLN
INTRAMUSCULAR | Status: DC | PRN
  Administered 2015-12-22: 2 mg via INTRAVENOUS

## 2015-12-22 MED ORDER — SODIUM CHLORIDE 0.9 % IV SOLN: INTRAVENOUS | Status: DC

## 2015-12-22 MED ORDER — FENTANYL CITRATE (PF) 100 MCG/2ML IJ SOLN
INTRAMUSCULAR | Status: AC
  Filled 2015-12-22: qty 2

## 2015-12-22 MED ORDER — DIAZEPAM 5 MG PO TABS
5.0000 mg | ORAL_TABLET | Freq: Four times a day (QID) | ORAL | Status: DC | PRN
Start: 2015-12-22 — End: 2015-12-23
  Administered 2015-12-23: 5 mg via ORAL
  Filled 2015-12-22: qty 1

## 2015-12-22 MED ORDER — SODIUM CHLORIDE 0.9% FLUSH: 3.0000 mL | INTRAVENOUS | Status: DC | PRN

## 2015-12-22 SURGICAL SUPPLY — 10 items
CATH 5FR JL4 DIAGNOSTIC (CATHETERS) ×3 IMPLANT
CATH IMPULSE 5F ANG/FL3.5 (CATHETERS) ×3 IMPLANT
CATH INFINITI 5FR JL5 (CATHETERS) ×3 IMPLANT
KIT HEART LEFT (KITS) ×3 IMPLANT
PACK CARDIAC CATHETERIZATION (CUSTOM PROCEDURE TRAY) ×3 IMPLANT
SHEATH PINNACLE 5F 10CM (SHEATH) ×3 IMPLANT
SYR MEDRAD MARK V 150ML (SYRINGE) ×3 IMPLANT
TRANSDUCER W/STOPCOCK (MISCELLANEOUS) ×3 IMPLANT
TUBING CIL FLEX 10 FLL-RA (TUBING) ×3 IMPLANT
WIRE EMERALD 3MM-J .035X150CM (WIRE) ×3 IMPLANT

## 2015-12-22 NOTE — Interval H&P Note (Signed)
Cath Lab Visit (complete for each Cath Lab visit)  Clinical Evaluation Leading to the Procedure:   ACS: No.  Non-ACS:    Anginal Classification: CCS IV  Anti-ischemic medical therapy: Maximal Therapy (2 or more classes of medications)  Non-Invasive Test Results: No non-invasive testing performed  Prior CABG: Previous CABG      History and Physical Interval Note:  12/22/2015 3:05 PM  Reginald Randolph  has presented today for surgery, with the diagnosis of cp  The various methods of treatment have been discussed with the patient and family. After consideration of risks, benefits and other options for treatment, the patient has consented to  Procedure(s): Left Heart Cath and Cors/Grafts Angiography (N/A) as a surgical intervention .  The patient's history has been reviewed, patient examined, no change in status, stable for surgery.  I have reviewed the patient's chart and labs.  Questions were answered to the patient's satisfaction.     Nicki Guadalajarahomas Kelly

## 2015-12-22 NOTE — Progress Notes (Signed)
225fr sheath aspirated and removed from RFA manual  Pressure applied for 20 minutes. Groin level 0. No S+S of hematoma. Bedrest instructions given, tegaderm dressing applied.  Bilateral DP and PT pulses palpable. Right PT weak but present.   Bedrest begins at 16:30:00

## 2015-12-22 NOTE — Progress Notes (Signed)
ANTICOAGULATION CONSULT NOTE - Follow-up Consult  Pharmacy Consult for Heparin Indication: chest pain/ACS  No Known Allergies  Patient Measurements: Height: 5\' 4"  (162.6 cm) Weight: 218 lb 14.4 oz (99.3 kg) IBW/kg (Calculated) : 59.2 Heparin Dosing Weight: 81.5kg  Vital Signs: Temp: 97.9 F (36.6 C) (10/09 0606) BP: 122/50 (10/09 0606)  Labs:  Recent Labs  12/20/15 0901 12/20/15 1542 12/20/15 2040 12/21/15 0002 12/21/15 0351 12/21/15 0625 12/22/15 0348  HGB 14.3  --   --   --  13.8  --  13.6  HCT 42.2  --   --   --  42.2  --  41.7  PLT 187  --   --   --  190  --  193  HEPARINUNFRC  --   --   --  0.53  --  0.60 0.41  CREATININE 1.13  --   --   --   --   --   --   TROPONINI  --  <0.03 <0.03  --  <0.03  --   --     Estimated Creatinine Clearance: 73 mL/min (by C-G formula based on SCr of 1.13 mg/dL).   Assessment: 60 yo M with hx CABG and multiple stents, on IV heparin for chest pain. Troponin neg x 3. Heparin level therapeutic on gtt at 1200 units/hr. CBC stable, no bleeding noted.  Noted plans for cardiac cath today.  Goal of Therapy:  Heparin level 0.3-0.7 units/ml Monitor platelets by anticoagulation protocol: Yes   Plan:  Continue heparin drip at 1200 units/hr F/u daily heparin level and CBC Follow-up after cardiac cath.  Toys 'R' UsKimberly Larya Charpentier, Pharm.D., BCPS Clinical Pharmacist Pager [phone] 12/22/2015 1:35 PM

## 2015-12-22 NOTE — H&P (View-Only) (Signed)
Subjective:  Had recurrent chest pain earlier today that awakened him from sleep and resolved after 20 minutes.  Resting comfortably now.    Review of records show that he had bypass grafting in [place] in [place] with a mammary to the LAD and vein graft to the right coronary artery and a vein graft to the circumflex.  [facility] [place] where he says he had 2 stents.  [facility] showed his grafts to be patent and he was reported to have a 70-80 percent stenosis in the distal LAD and at that point was placed on Ranexa.  Objective:  Vital Signs in the last 24 hours: BP (!) 122/50   Pulse 66   Temp 97.9 F (36.6 C)   Resp 18   Ht 5\' 4"  (1.626 m)   Wt 99.3 kg (218 lb 14.4 oz)   SpO2 98%   BMI 37.57 kg/m   Physical Exam: Obese male in no acute distress currently Lungs:  Clear Cardiac:  Regular rhythm, normal S1 and S2, no S3 Extremities:  No edema present  Intake/Output from previous day: 10/08 0701 - 10/09 0700 In: 845.6 [P.O.:120; I.V.:725.6] Out: 1375 [Urine:1375]  Weight Filed Weights   12/20/15 0800 12/20/15 1437 12/22/15 0606  Weight: 102.1 kg (225 lb) 99.4 kg (219 lb 1.6 oz) 99.3 kg (218 lb 14.4 oz)    Lab Results: Basic Metabolic Panel:  Recent Labs  04/54/908/09/28 0901  NA 136  K 3.9  CL 107  CO2 23  GLUCOSE 100*  BUN 17  CREATININE 1.13   CBC:  Recent Labs  12/20/15 0901 12/21/15 0351 12/22/15 0348  WBC 9.5 9.4 8.4  NEUTROABS 6.1  --   --   HGB 14.3 13.8 13.6  HCT 42.2 42.2 41.7  MCV 93.4 94.6 94.8  PLT 187 190 193   Cardiac Enzymes: Troponin (Point of Care Test)  Recent Labs  12/20/15 0858  TROPIPOC 0.00   Cardiac Panel (last 3 results)  Recent Labs  12/20/15 1542 12/20/15 2040 12/21/15 0351  TROPONINI <0.03 <0.03 <0.03    Telemetry: Sinus rhythm  Assessment/Plan:  1.  Recurrent chest pain in a patient with distal LAD disease and previous  bypass grafting and reported stenting although haven't verified by records yet he is currently on heparin and nitroglycerin and is currently in prison.   2.  CAD as noted above 3.  Hypertension  Recommendations:  Catheterization later today.      Darden PalmerW. Spencer Trason Shifflet, Jr.  MD [facility] Cardiology  12/22/2015, 8:41 AM

## 2015-12-22 NOTE — Plan of Care (Signed)
Problem: Phase I Progression Outcomes Goal: Anginal pain relieved Outcome: Not Progressing The clients chest pain never fully goes away. The morphine helps decease the pain but the lowest I have gotten it in a 2 on the 0 out of 10 pain scale. The client should be going for a cath today, time pending.

## 2015-12-22 NOTE — Progress Notes (Signed)
Subjective:  Had recurrent chest pain earlier today that awakened him from sleep and resolved after 20 minutes.  Resting comfortably now.    Review of records show that he had bypass grafting in [place] in 2010 with a mammary to the LAD and vein graft to the right coronary artery and a vein graft to the circumflex.  We've not received records from [facility] in [place] where he says he had 2 stents.  The catheterization done at [facility] on March 2016 showed his grafts to be patent and he was reported to have a 70-80 percent stenosis in the distal LAD and at that point was placed on Ranexa.  Objective:  Vital Signs in the last 24 hours: BP (!) 122/50   Pulse 66   Temp 97.9 F (36.6 C)   Resp 18   Ht 5' 4" (1.626 m)   Wt 99.3 kg (218 lb 14.4 oz)   SpO2 98%   BMI 37.57 kg/m   Physical Exam: Obese male in no acute distress currently Lungs:  Clear Cardiac:  Regular rhythm, normal S1 and S2, no S3 Extremities:  No edema present  Intake/Output from previous day: 10/08 0701 - 10/09 0700 In: 845.6 [P.O.:120; I.V.:725.6] Out: 1375 [Urine:1375]  Weight Filed Weights   12/20/15 0800 12/20/15 1437 12/22/15 0606  Weight: 102.1 kg (225 lb) 99.4 kg (219 lb 1.6 oz) 99.3 kg (218 lb 14.4 oz)    Lab Results: Basic Metabolic Panel:  Recent Labs  12/20/15 0901  NA 136  K 3.9  CL 107  CO2 23  GLUCOSE 100*  BUN 17  CREATININE 1.13   CBC:  Recent Labs  12/20/15 0901 12/21/15 0351 12/22/15 0348  WBC 9.5 9.4 8.4  NEUTROABS 6.1  --   --   HGB 14.3 13.8 13.6  HCT 42.2 42.2 41.7  MCV 93.4 94.6 94.8  PLT 187 190 193   Cardiac Enzymes: Troponin (Point of Care Test)  Recent Labs  12/20/15 0858  TROPIPOC 0.00   Cardiac Panel (last 3 results)  Recent Labs  12/20/15 1542 12/20/15 2040 12/21/15 0351  TROPONINI <0.03 <0.03 <0.03    Telemetry: Sinus rhythm  Assessment/Plan:  1.  Recurrent chest pain in a patient with distal LAD disease and previous  bypass grafting and reported stenting although haven't verified by records yet he is currently on heparin and nitroglycerin and is currently in prison.   2.  CAD as noted above 3.  Hypertension  Recommendations:  Catheterization later today.      W. Spencer Kabao Leite, Jr.  MD FACC Cardiology  12/22/2015, 8:41 AM   

## 2015-12-23 ENCOUNTER — Encounter (HOSPITAL_COMMUNITY): Payer: Self-pay | Admitting: Cardiovascular Disease

## 2015-12-23 ENCOUNTER — Other Ambulatory Visit (HOSPITAL_COMMUNITY)

## 2015-12-23 LAB — BASIC METABOLIC PANEL
Anion gap: 9 (ref 5–15)
BUN: 15 mg/dL (ref 6–20)
CHLORIDE: 103 mmol/L (ref 101–111)
CO2: 22 mmol/L (ref 22–32)
CREATININE: 1.27 mg/dL — AB (ref 0.61–1.24)
Calcium: 9.1 mg/dL (ref 8.9–10.3)
GFR calc Af Amer: >60 mL/min (ref >60)
GFR calc non Af Amer: 59 mL/min — ABNORMAL LOW (ref >60)
GLUCOSE: 86 mg/dL (ref 65–99)
Potassium: 4.1 mmol/L (ref 3.5–5.1)
Sodium: 134 mmol/L — ABNORMAL LOW (ref 135–145)

## 2015-12-23 MED ORDER — NITROGLYCERIN 0.4 MG SL SUBL: 0.4000 mg | SUBLINGUAL_TABLET | SUBLINGUAL | 12 refills | Status: AC | PRN

## 2015-12-23 NOTE — Discharge Summary (Signed)
Physician Discharge Summary  Patient ID: Reginald Randolph MRN: [id number] DOB/AGE: March 05, 1956 60 y.o.  Admit date: 12/20/2015 Discharge date: 12/23/2015  Primary Discharge Diagnosis:  1.  Chest pain consistent with angina pectoris  Secondary Discharge Diagnosis: 2.  Severe three-vessel coronary artery disease with previous bypass grafting and reported stenting 3.  Hypertension controlled 4.  Hyperlipidemia under treatment 5.  Obesity 6.  History of hepatitis C  Procedures:  Cardiac catheterization  Hospital Course: This 60 year old male comes in from [facility] institution with chest discomfort.  The patient has a prior cardiac history having had bypass grafting in 2009 at [facility] in [place] when he was in prison at that time.  He evidently had bypass grafting 3 with a mammary to the LAD, a vein graft to the right coronary artery and a vein graft to the circumflex.  He had a myocardial infarction and had stents placed in 2015 when in [place].  He was incarcerated a second time and evidently had a catheterization about a year and a half ago [facility] [place] and was told that he had some small blockages that needed to be treated medically.  He has been incarcerated a third time and while sitting at the table eating breakfast point had the onset of midsternal chest pain associated with shortness of breath and sweating with some sharp pain in his left chest area.  He was transported to the emergency room and is now admitted to the floor.  He is having some ongoing chest pain he describes as sharp.  Initial troponin is negative.  His EKG was nonischemic.  He has a history of severe low back pain and uses a neurostimulator.  He is currently disabled.  He is obese.  He is currently a nonsmoker.  He normally does not have anginal type pain.  His medical care is mostly through the [place] but he also sees a doctor in [place] at times where he lives.   Denies PND, orthopnea or edema.  He carries a diagnosis of congestive heart failure and had an echocardiogram at some point and was told his heart was 55%.  The patient was placed on intravenous heparin and was on a telemetry floor.  Serial troponins were negative.  He continued to have some intermittent chest discomfort not associated with EKG changes and it was recommended that he have a repeat catheterization.  His coronary arteries were calcified.  There is diffuse narrowing of the left main with an estimated diffuse 40% stenosis.  LAD had bidirectional flow with tandem 80 and 70% stenosis in the midportion circumflex was occluded right coronary artery was occluded.  Mammary graft to LAD was widely patent and the saphenous vein graft to the right coronary artery is widely patent and the saphenous thing graft to the OM was widely patent.  His catheterization site was clean and dry the next morning.  It was thought that he would likely have some residual angina and she continued to be treated medically.  No changes were made in his medical regimen.  It is recommended that he lose weight, continue a program of exercise, and use nitroglycerin for chest pain.  His follow-up care will be done at the correctional institution where he is incarcerated currently.  Discharge Exam: Blood pressure (!) 104/52, pulse 76, temperature 97.9 F (36.6 C), temperature source Oral, resp. rate 17, height 5\' 4"  (1.626 m), weight 99.7 kg (219 lb 14.4 oz), SpO2 95 %. Weight: 99.7 kg (  219 lb 14.4 oz) His femoral cath site was clean and dry without hematoma or ecchymoses, lungs were clear there was no S3 Labs: CBC:   Lab Results  Component Value Date   WBC 8.4 12/22/2015   HGB 13.6 12/22/2015   HCT 41.7 12/22/2015   MCV 94.8 12/22/2015   PLT 193 12/22/2015    CMP:  Recent Labs Lab 12/20/15 0901 12/23/15 0449  NA 136 134*  K 3.9 4.1  CL 107 103  CO2 23 22  BUN 17 15  CREATININE 1.13 1.27*  CALCIUM 9.2 9.1   PROT 7.6  --   BILITOT 0.4  --   ALKPHOS 82  --   ALT 30  --   AST 25  --   GLUCOSE 100* 86    Lipid Panel     Component Value Date/Time   CHOL 112 12/21/2015 0351   TRIG 101 12/21/2015 0351   HDL 38 (L) 12/21/2015 0351   CHOLHDL 2.9 12/21/2015 0351   VLDL 20 12/21/2015 0351   LDLCALC 54 12/21/2015 0351    Cardiac Enzymes:  Recent Labs  12/20/15 1542 12/20/15 2040 12/21/15 0351  TROPONINI <0.03 <0.03 <0.03    Radiology: Minimal scarring in the lungs, aortic root calcification  EKG: Rightward axis, borderline EKG  Discharge Medications:   Medication List    TAKE these medications   amLODipine 10 MG tablet Commonly known as:  NORVASC Take 10 mg by mouth daily.   aspirin EC 81 MG tablet Take 81 mg by mouth daily.   atorvastatin 40 MG tablet Commonly known as:  LIPITOR Take 40 mg by mouth daily.   carvedilol 12.5 MG tablet Commonly known as:  COREG Take 12.5 mg by mouth 2 (two) times daily with a meal.   clopidogrel 75 MG tablet Commonly known as:  PLAVIX Take 75 mg by mouth daily.   lisinopril 10 MG tablet Commonly known as:  PRINIVIL,ZESTRIL Take 10 mg by mouth daily.   nitroGLYCERIN 0.4 MG SL tablet Commonly known as:  NITROSTAT Place 1 tablet (0.4 mg total) under the tongue every 5 (five) minutes x 3 doses as needed for chest pain.   ranolazine 500 MG 12 hr tablet Commonly known as:  RANEXA Take 1,000 mg by mouth 2 (two) times daily.       Followup plans and appointments: The patient is to follow-up with the health care system at the prison unit.  He may use nitroglycerin for chest pain as needed.  Time spent with patient to include physician time:  30 minutes  Signed: Darden PalmerW. Spencer Tilley, Jr. MD [facility] 12/23/2015, 8:41 AM

## 2015-12-23 NOTE — Discharge Instructions (Signed)
Angina Pectoris Angina pectoris, often called angina, is extreme discomfort in the chest, neck, or arm. This is caused by a lack of blood in the middle and thickest layer of the heart wall (myocardium). There are four types of angina:  Stable angina. Stable angina usually occurs in episodes of predictable frequency and duration. It is usually brought on by physical activity, stress, or excitement. Stable angina usually lasts a few minutes and can often be relieved by a medicine that you place under your tongue. This medicine is called sublingual nitroglycerin.  Unstable angina. Unstable angina can occur even when you are doing little or no physical activity. It can even occur while you are sleeping or when you are at rest. It can suddenly increase in severity or frequency. It may not be relieved by sublingual nitroglycerin, and it can last up to 30 minutes.  Microvascular angina. This type of angina is caused by a disorder of tiny blood vessels called arterioles. Microvascular angina is more common in women. The pain may be more severe and last longer than other types of angina pectoris.  Prinzmetal or variant angina. This type of angina pectoris is rare and usually occurs when you are doing little or no physical activity. It especially occurs in the early morning hours. CAUSES Atherosclerosis is the cause of angina. This is the buildup of fat and cholesterol (plaque) on the inside of the arteries. Over time, the plaque may narrow or block the artery, and this will lessen blood flow to the heart. Plaque can also become weak and break off within a coronary artery to form a clot and cause a sudden blockage. RISK FACTORS Risk factors common to both men and women include:  High cholesterol levels.  High blood pressure (hypertension).  Tobacco use.  Diabetes.  Family history of angina.  Obesity.  Lack of exercise.  A diet high in saturated fats. Women are at greater risk for angina if  they are:  Over age 72.  Postmenopausal. SYMPTOMS Many people do not experience any symptoms during the early stages of angina. As the condition progresses, symptoms common to both men and women may include:  Chest pain.  The pain can be described as a crushing or squeezing in the chest, or a tightness, pressure, fullness, or heaviness in the chest.  The pain can last more than a few minutes, or it can stop and recur.  Pain in the arms, neck, jaw, or back.  Unexplained heartburn or indigestion.  Shortness of breath.  Nausea.  Sudden cold sweats.  Sudden light-headedness. Many women have chest discomfort and some of the other symptoms. However, women often have different (atypical) symptoms, such as:   Fatigue.  Unexplained feelings of nervousness or anxiety.  Unexplained weakness.  Dizziness or fainting. Sometimes, women may have angina without any symptoms. DIAGNOSIS  Tests to diagnose angina may include:  ECG (electrocardiogram).  Exercise stress test. This looks for signs of blockage when the heart is being exercised.  Pharmacologic stress test. This test looks for signs of blockage when the heart is being stressed with a medicine.  Blood tests.  Coronary angiogram. This is a procedure to look at the coronary arteries to see if there is any blockage. TREATMENT  The treatment of angina may include the following:  Healthy behavioral changes to reduce or control risk factors.  Medicine.  Coronary stenting.A stent helps to keep an artery open.  Coronary angioplasty. This procedure widens a narrowed or blocked artery.  Coronary  arterybypass surgery. This will allow your blood to pass the blockage (bypass) to reach your heart. HOME CARE INSTRUCTIONS   Take medicines only as directed by your health care provider.  Do not take the following medicines unless your health care provider approves:  Nonsteroidal anti-inflammatory drugs (NSAIDs), such as  ibuprofen, naproxen, or celecoxib.  Vitamin supplements that contain vitamin A, vitamin E, or both.  Hormone replacement therapy that contains estrogen with or without progestin.  Manage other health conditions such as hypertension and diabetes as directed by your health care provider.  Follow a heart-healthy diet. A dietitian can help to educate you about healthy food options and changes.  Use healthy cooking methods such as roasting, grilling, broiling, baking, poaching, steaming, or stir-frying. Talk to a dietitian to learn more about healthy cooking methods.  Follow an exercise program approved by your health care provider.  Maintain a healthy weight. Lose weight as approved by your health care provider.  Plan rest periods when fatigued.  Learn to manage stress.  Do not use any tobacco products, including cigarettes, chewing tobacco, or electronic cigarettes. If you need help quitting, ask your health care provider.  If you drink alcohol, and your health care provider approves, limit your alcohol intake to no more than 1 drink per day. One drink equals 12 ounces of beer, 5 ounces of wine, or 1 ounces of hard liquor.  Stop illegal drug use.  Keep all follow-up visits as directed by your health care provider. This is important. SEEK IMMEDIATE MEDICAL CARE IF:   You have pain in your chest, neck, arm, jaw, stomach, or back that lasts more than a few minutes, is recurring, or is unrelieved by taking sublingualnitroglycerin.  You have profuse sweating without cause.  You have unexplained:  Heartburn or indigestion.  Shortness of breath or difficulty breathing.  Nausea or vomiting.  Fatigue.  Feelings of nervousness or anxiety.  Weakness.  Diarrhea.  You have sudden light-headedness or dizziness.  You faint. These symptoms may represent a serious problem that is an emergency. Do not wait to see if the symptoms will go away. Get medical help right away. Call your  local emergency services (911 in the [place] Do not drive yourself to the hospital.   This information is not intended to replace advice given to you by your health care provider. Make sure you discuss any questions you have with your health care provider.   Document Released: 03/01/2005 Document Revised: 03/22/2014 Document Reviewed: 07/03/2013 Elsevier Interactive Patient Education [employer] Follow-up with your physician at the prison.  Keep the catheterization site clean and dry for the next 24 hours without bathing in the tub.  He you may shower.  He is nitroglycerin as needed for chest pain.  Follow a good diet to lose weight.

## 2015-12-23 NOTE — Care Management Note (Signed)
Case Management Note  Patient Details  Name: Reginald SangerLarry XXXVandyke MRN: [id number] Date of Birth: 17-Jul-1955  Subjective/Objective:  [facility] for [facility] pain[facility] Plan will be to return 12-23-15.                   Action/Plan: No needs identified by CM at this time.   Expected Discharge Date:                  Expected Discharge Plan:  [facility]  In-House Referral:  NA  Discharge planning Services  CM Consult  Post Acute Care Choice:  NA Choice offered to:  NA  DME Arranged:  N/A DME Agency:  NA  HH Arranged:  NA HH Agency:  NA  Status of Service:  Completed, signed off  If discussed at Long Length of Stay Meetings, dates discussed:    Additional Comments:  Gala LewandowskyGraves-Bigelow, Chrystie Hagwood Kaye, RN 12/23/2015, 10:08 AM

## 2015-12-31 ENCOUNTER — Emergency Department (HOSPITAL_COMMUNITY)

## 2015-12-31 ENCOUNTER — Emergency Department (HOSPITAL_COMMUNITY)
Admission: EM | Admit: 2015-12-31 | Discharge: 2015-12-31 | Disposition: A | Discharge status: Home / Self Care | Attending: Emergency Medicine | Admitting: Emergency Medicine

## 2015-12-31 ENCOUNTER — Encounter (HOSPITAL_COMMUNITY): Payer: Self-pay

## 2015-12-31 DIAGNOSIS — E119 Type 2 diabetes mellitus without complications: Secondary | ICD-10-CM | POA: Diagnosis not present

## 2015-12-31 DIAGNOSIS — Z87891 Personal history of nicotine dependence: Secondary | ICD-10-CM | POA: Diagnosis not present

## 2015-12-31 DIAGNOSIS — I10 Essential (primary) hypertension: Secondary | ICD-10-CM | POA: Diagnosis not present

## 2015-12-31 DIAGNOSIS — Z79899 Other long term (current) drug therapy: Secondary | ICD-10-CM | POA: Insufficient documentation

## 2015-12-31 DIAGNOSIS — Z7982 Long term (current) use of aspirin: Secondary | ICD-10-CM | POA: Insufficient documentation

## 2015-12-31 DIAGNOSIS — I2510 Atherosclerotic heart disease of native coronary artery without angina pectoris: Secondary | ICD-10-CM | POA: Insufficient documentation

## 2015-12-31 DIAGNOSIS — R079 Chest pain, unspecified: Secondary | ICD-10-CM | POA: Diagnosis not present

## 2015-12-31 LAB — I-STAT TROPONIN, ED: TROPONIN I, POC: 0 ng/mL (ref 0.00–0.08)

## 2015-12-31 LAB — CBC WITH DIFFERENTIAL/PLATELET
BASOS ABS: 0.1 10*3/uL (ref 0.0–0.1)
BASOS PCT: 1 %
EOS ABS: 0.5 10*3/uL (ref 0.0–0.7)
EOS PCT: 5 %
HCT: 41.7 % (ref 39.0–52.0)
Hemoglobin: 14.3 g/dL (ref 13.0–17.0)
Lymphocytes Relative: 22 %
Lymphs Abs: 2.1 10*3/uL (ref 0.7–4.0)
MCH: 32.1 pg (ref 26.0–34.0)
MCHC: 34.3 g/dL (ref 30.0–36.0)
MCV: 93.5 fL (ref 78.0–100.0)
MONO ABS: 1.1 10*3/uL — AB (ref 0.1–1.0)
Monocytes Relative: 11 %
Neutro Abs: 6 10*3/uL (ref 1.7–7.7)
Neutrophils Relative %: 61 %
PLATELETS: 255 10*3/uL (ref 150–400)
RBC: 4.46 MIL/uL (ref 4.22–5.81)
RDW: 13.8 % (ref 11.5–15.5)
WBC: 9.7 10*3/uL (ref 4.0–10.5)

## 2015-12-31 LAB — TROPONIN I: Troponin I: <0.03 ng/mL (ref <0.03)

## 2015-12-31 LAB — BASIC METABOLIC PANEL
ANION GAP: 7 (ref 5–15)
BUN: 21 mg/dL — ABNORMAL HIGH (ref 6–20)
CALCIUM: 9.3 mg/dL (ref 8.9–10.3)
CO2: 24 mmol/L (ref 22–32)
CREATININE: 1.32 mg/dL — AB (ref 0.61–1.24)
Chloride: 104 mmol/L (ref 101–111)
GFR, EST NON AFRICAN AMERICAN: 57 mL/min — AB (ref >60)
Glucose, Bld: 95 mg/dL (ref 65–99)
Potassium: 4.1 mmol/L (ref 3.5–5.1)
Sodium: 135 mmol/L (ref 135–145)

## 2015-12-31 MED ORDER — ONDANSETRON HCL 4 MG/2ML IJ SOLN
4.0000 mg | Freq: Once | INTRAMUSCULAR | Status: AC
  Administered 2015-12-31: 4 mg via INTRAVENOUS
  Filled 2015-12-31: qty 2

## 2015-12-31 MED ORDER — MORPHINE SULFATE (PF) 4 MG/ML IV SOLN
4.0000 mg | Freq: Once | INTRAVENOUS | Status: AC
  Administered 2015-12-31: 4 mg via INTRAVENOUS
  Filled 2015-12-31: qty 1

## 2015-12-31 MED ORDER — MORPHINE SULFATE (PF) 4 MG/ML IV SOLN
4.0000 mg | Freq: Once | INTRAVENOUS | Status: AC
Start: 2015-12-31 — End: 2015-12-31
  Administered 2015-12-31: 4 mg via INTRAVENOUS
  Filled 2015-12-31: qty 1

## 2015-12-31 NOTE — ED Notes (Signed)
Patient with no complaints at this time. Respirations even and unlabored. Skin warm/dry. Discharge instructions reviewed with patient at this time. Patient given opportunity to voice concerns/ask questions. IV removed per policy and band-aid applied to site. Patient discharged at this time and left Emergency Department with steady gait.  

## 2015-12-31 NOTE — Discharge Instructions (Signed)
Tests for heart attack call troponin was negative 2 different blood draws.   Continue to use nitroglycerin as needed. Follow-up with cardiologist.

## 2015-12-31 NOTE — ED Provider Notes (Signed)
[facility]-EMERGENCY DEPT Provider Note   CSN: [id number] Arrival date & time: 12/31/15  1452     History   Chief Complaint Chief Complaint  Patient presents with  . Chest Pain    HPI Reginald Randolph is a 60 y.o. male.  Patient with known severe coronary artery disease presents with a squeezing sensation in his anterior chest with radiation to the jaw and left arm. He had a cardiac catheterization last week in the [facility] system and was advised medical management only. He was short of breath and diaphoretic. He is taken several nitroglycerin and aspirin. Pain is reduced now. Status post CABG.      Past Medical History:  Diagnosis Date  . CHF (congestive heart failure) (HCC)   . Coronary artery disease   . Hepatitis C   . Hyperlipidemia   . Hypertension   . Myocardial infarct x 2  . Obesity (BMI 30-39.9)     Patient Active Problem List   Diagnosis Date Noted  . Unstable angina pectoris (HCC)   . Angina at rest [facility](HCC) 12/20/2015  . Coronary artery disease   . Hyperlipidemia   . Hypertension   . Hepatitis C     Past Surgical History:  Procedure Laterality Date  . APPENDECTOMY    . CARDIAC CATHETERIZATION N/A 12/22/2015   Procedure: Left Heart Cath and Cors/Grafts Angiography;  Surgeon: Lennette Biharihomas A Kelly, MD;  [facility];  Service: Cardiovascular;  Laterality: N/A;  . CERVICAL LAMINECTOMY    . CHOLECYSTECTOMY    . CORONARY ARTERY BYPASS GRAFT  2009  . CORONARY STENT PLACEMENT  2015  . HERNIA REPAIR    . LUMBAR LAMINECTOMY    . REPLACEMENT TOTAL KNEE Right        Home Medications    Prior to Admission medications   Medication Sig Start Date End Date Taking? Authorizing Provider  albuterol (PROVENTIL HFA;VENTOLIN HFA) 108 (90 Base) MCG/ACT inhaler Inhale 2 puffs into the lungs every 6 (six) hours as needed for wheezing or shortness of breath.   Yes Historical Provider, MD  amLODipine (NORVASC) 10 MG tablet Take 10 mg by mouth daily.   Yes Historical  Provider, MD  aspirin EC 81 MG tablet Take 81 mg by mouth daily.   Yes Historical Provider, MD  atorvastatin (LIPITOR) 40 MG tablet Take 40 mg by mouth daily at 6 PM.    Yes Historical Provider, MD  carvedilol (COREG) 12.5 MG tablet Take 12.5 mg by mouth 2 (two) times daily with a meal.   Yes Historical Provider, MD  clopidogrel (PLAVIX) 75 MG tablet Take 75 mg by mouth daily.   Yes Historical Provider, MD  isosorbide mononitrate (ISMO,MONOKET) 20 MG tablet Take 20 mg by mouth daily.   Yes Historical Provider, MD  lisinopril (PRINIVIL,ZESTRIL) 10 MG tablet Take 10 mg by mouth daily.   Yes Historical Provider, MD  nitroGLYCERIN (NITROSTAT) 0.4 MG SL tablet Place 1 tablet (0.4 mg total) under the tongue every 5 (five) minutes x 3 doses as needed for chest pain. 12/23/15  Yes Othella BoyerWilliam S Tilley, MD  ranolazine (RANEXA) 500 MG 12 hr tablet Take 1,000 mg by mouth 2 (two) times daily.   Yes Historical Provider, MD    Family History Family History  Problem Relation Age of Onset  . CAD Mother     CABG  . Cirrhosis Father     Social History Social History  Substance Use Topics  . Smoking status: Former Smoker    Packs/day:  1.00    Years: 40.00    Quit date: 12/19/2013  . Smokeless tobacco: Never Used  . Alcohol use No     Allergies   Review of patient's allergies indicates no known allergies.   Review of Systems Review of Systems  All other systems reviewed and are negative.    Physical Exam Updated Vital Signs BP (!) 94/54   Pulse 62   Temp 97.5 F (36.4 C) (Oral)   Resp 15   Ht 5\' 5"  (1.651 m)   Wt 215 lb (97.5 kg)   SpO2 94%   BMI 35.78 kg/m   Physical Exam  Constitutional: He is oriented to person, place, and time.  In pain  HENT:  Head: Normocephalic and atraumatic.  Eyes: Conjunctivae are normal.  Neck: Neck supple.  Cardiovascular: Normal rate and regular rhythm.   Pulmonary/Chest: Effort normal and breath sounds normal.  Abdominal: Soft. Bowel sounds are  normal.  Musculoskeletal: Normal range of motion.  Neurological: He is alert and oriented to person, place, and time.  Skin: Skin is warm and dry.  Psychiatric: He has a normal mood and affect. His behavior is normal.  Nursing note and vitals reviewed.    ED Treatments / Results  Labs (all labs ordered are listed, but only abnormal results are displayed) Labs Reviewed  CBC WITH DIFFERENTIAL/PLATELET - Abnormal; Notable for the following:       Result Value   Monocytes Absolute 1.1 (*)    All other components within normal limits  BASIC METABOLIC PANEL - Abnormal; Notable for the following:    BUN 21 (*)    Creatinine, Ser 1.32 (*)    GFR calc non Af Amer 57 (*)    All other components within normal limits  TROPONIN I  I-STAT TROPOININ, ED    EKG  EKG Interpretation None      Date: 12/31/2015  Rate: 78  Rhythm: normal sinus rhythm  QRS Axis: right axis  Intervals: normal  ST/T Wave abnormalities: normal  Conduction Disutrbances: none  Narrative Interpretation: unremarkable     Radiology Dg Chest Port 1 View  Result Date: 12/31/2015 CLINICAL DATA:  Chest pain EXAM: PORTABLE CHEST 1 VIEW COMPARISON:  December 20, 2015 FINDINGS: There is no edema or consolidation. Heart is borderline enlarged with pulmonary vascularity within normal limits. There is atherosclerotic calcification in the aorta. No adenopathy. Patient is status post median sternotomy. Thoracic stimulator leads in mid thoracic region. There is postoperative change in the lower cervical spine region. IMPRESSION: Aortic atherosclerosis. No edema or consolidation. Heart borderline enlarged, stable. Electronically Signed   By: Bretta Bang III M.D.   On: 12/31/2015 15:57    Procedures Procedures (including critical care time)  Medications Ordered in ED Medications  ondansetron (ZOFRAN) injection 4 mg (4 mg Intravenous Given 12/31/15 1544)  morphine 4 MG/ML injection 4 mg (4 mg Intravenous Given 12/31/15  1544)  morphine 4 MG/ML injection 4 mg (4 mg Intravenous Given 12/31/15 1727)     Initial Impression / Assessment and Plan / ED Course  I have reviewed the triage vital signs and the nursing notes.  Pertinent labs & imaging results that were available during my care of the patient were reviewed by me and considered in my medical decision making (see chart for details).  Clinical Course    Patient is status post cardiac catheterization last week. His pain has been relieved by morphine. Troponin negative 2. EKG shows no acute changes. Will follow-up as outpatient.  Final Clinical Impressions(s) / ED Diagnoses   Final diagnoses:  Chest pain, unspecified type    New Prescriptions Discharge Medication List as of 12/31/2015  6:37 PM       Donnetta Hutching, MD 12/31/15 1936

## 2015-12-31 NOTE — ED Triage Notes (Signed)
Pt states he was sitting on his bed reading when his chest pain started a few hours ago. States he took 4 of his NTG and ASA  324. Pt was given 3 NTG by EMS prior to arrival.

## 2018-04-14 IMAGING — DX DG CHEST 2V
2 series · 2 of 2 positions shown · non-contrast
Comparison: None.

CLINICAL DATA: Shortness of breath and chest pain

EXAM:
CHEST  2 VIEW

[chest pa]
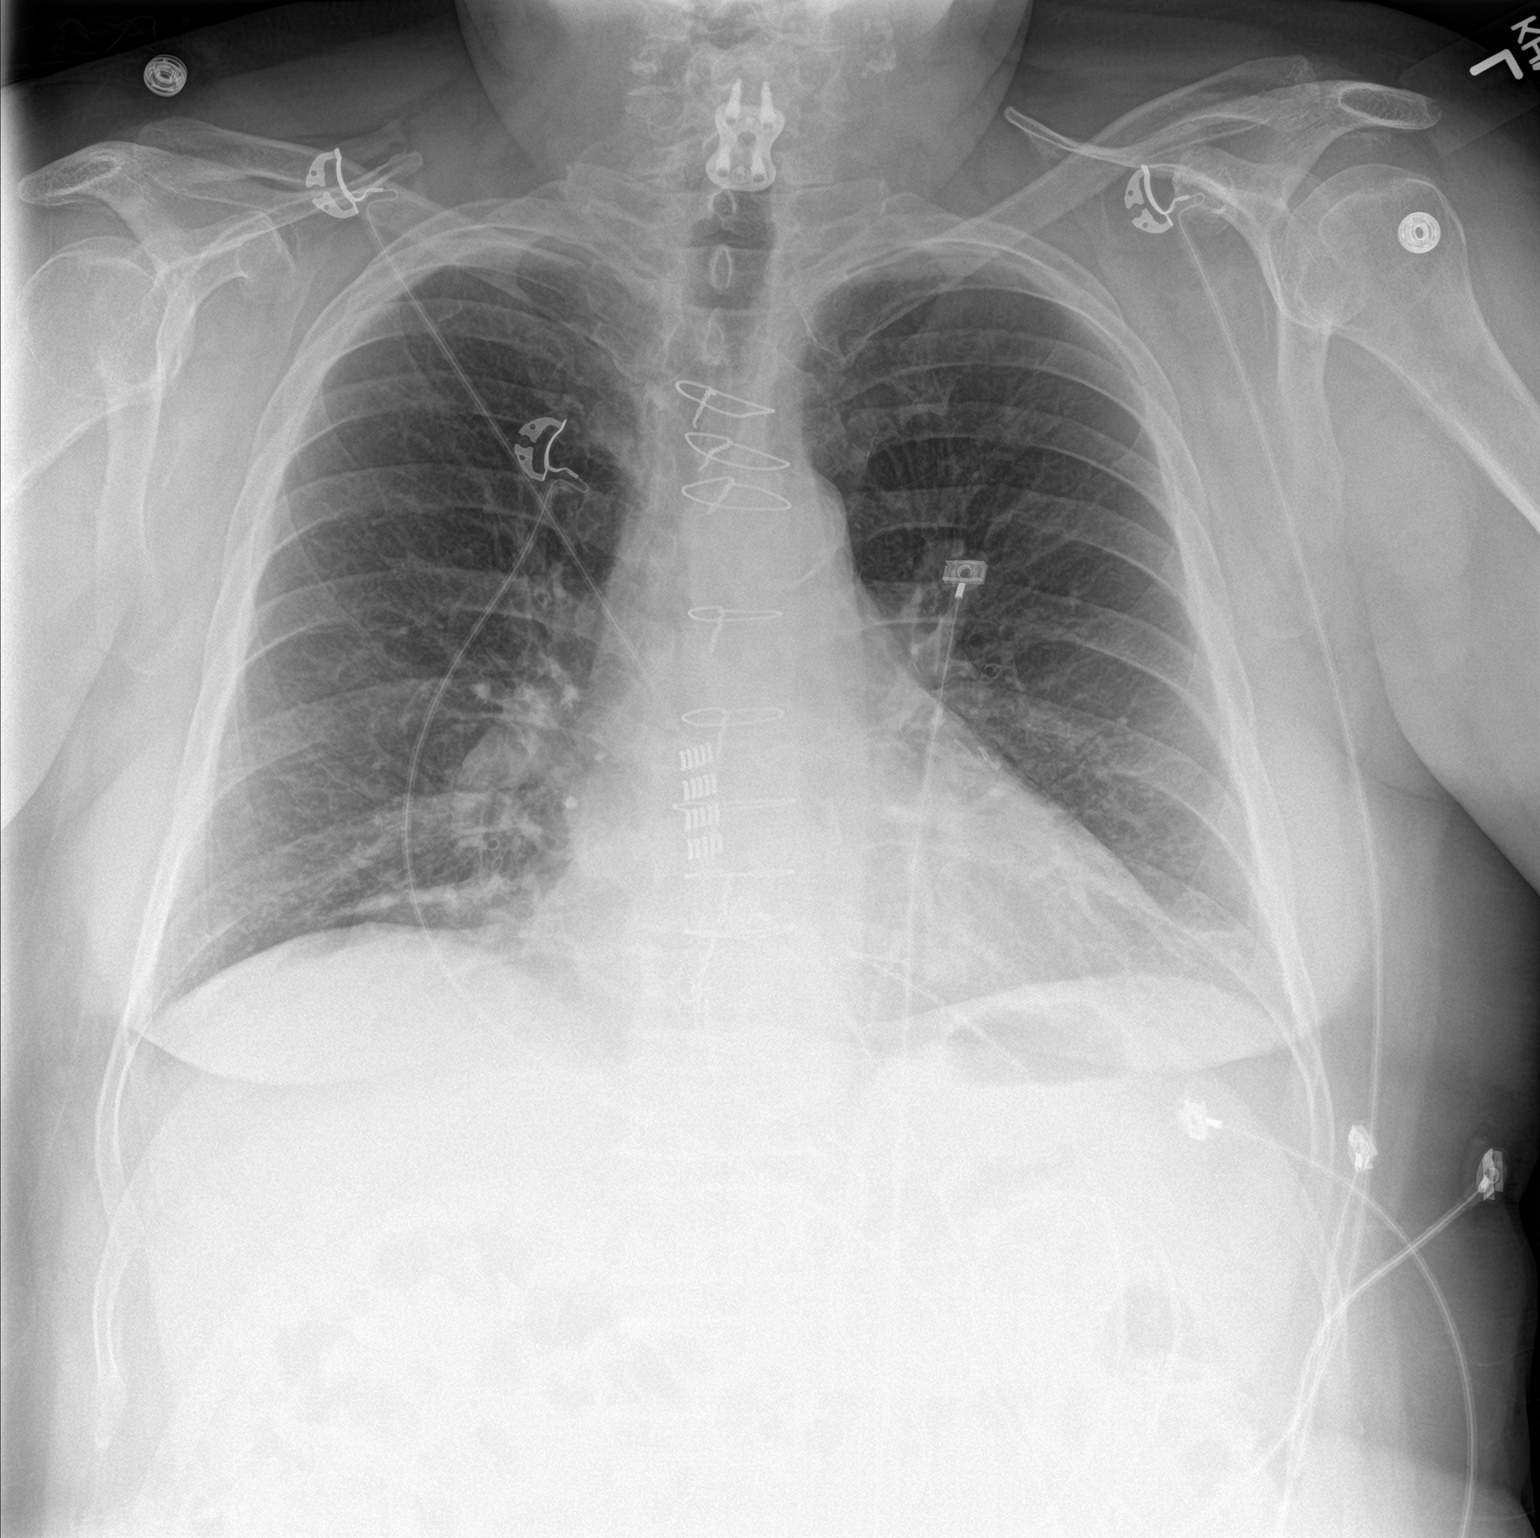

[chest lat]
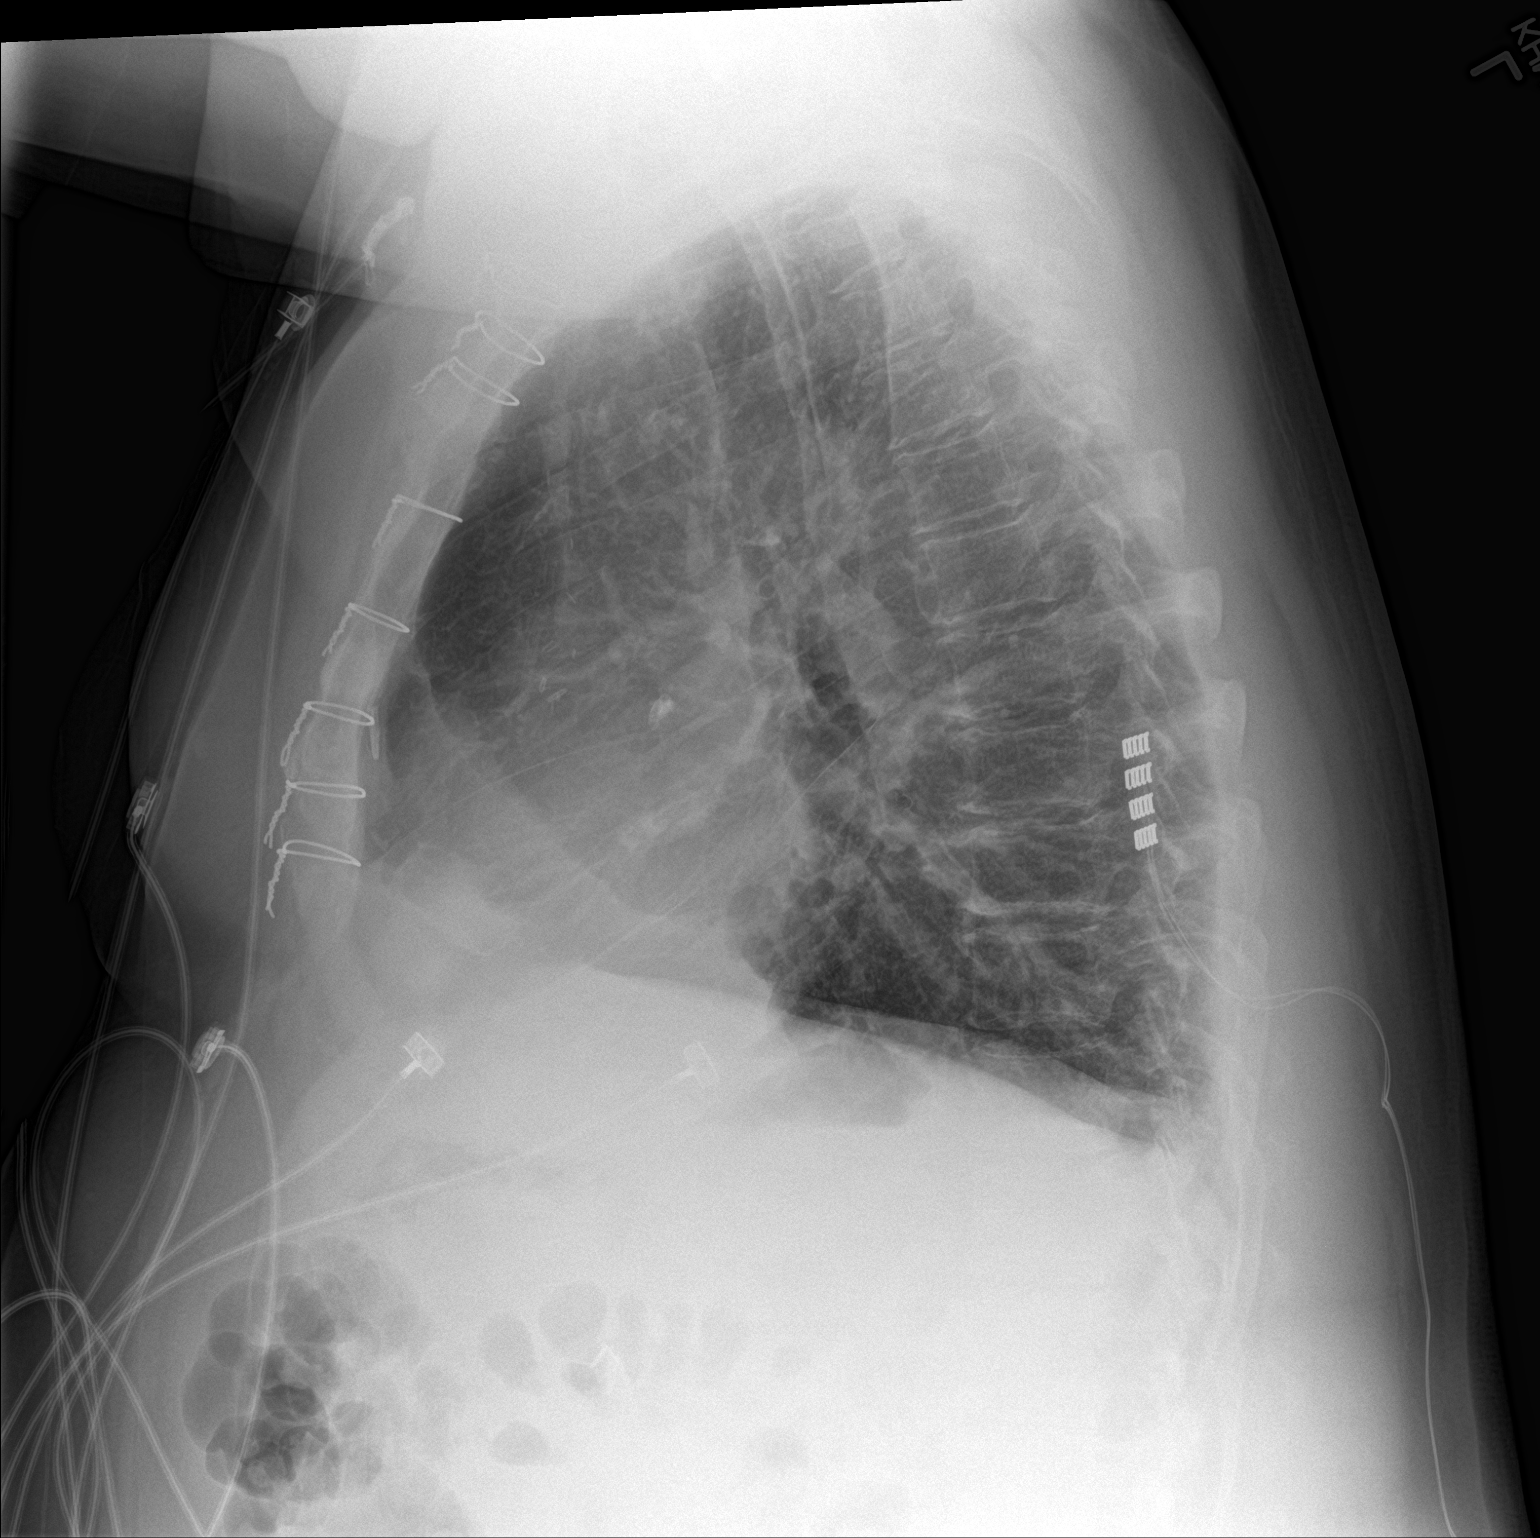

[2 of 2 positions shown; findings below may reference images not displayed]

FINDINGS: There is minimal scarring in the left base. There is no edema or
consolidation. The heart size and pulmonary vascularity are normal.
Patient is status post coronary artery bypass grafting. Native
coronary artery calcification noted. There is atherosclerotic
calcification in the aorta. There is a thoracic stimulator with tips
in the mid thoracic region. There is postoperative change in the
lower cervical spine. There are foci of carotid artery calcification
bilaterally. There is no evident adenopathy.
IMPRESSION: Slight scarring left base. No edema or consolidation. Aortic
atherosclerosis. Calcification in both carotid arteries. Heart size
within normal limits.

## 2018-04-25 IMAGING — CR DG CHEST 1V PORT
1 series · 1 of 1 positions shown · non-contrast
Comparison: December 20, 2015

CLINICAL DATA: Chest pain

EXAM:
PORTABLE CHEST 1 VIEW

[portable]
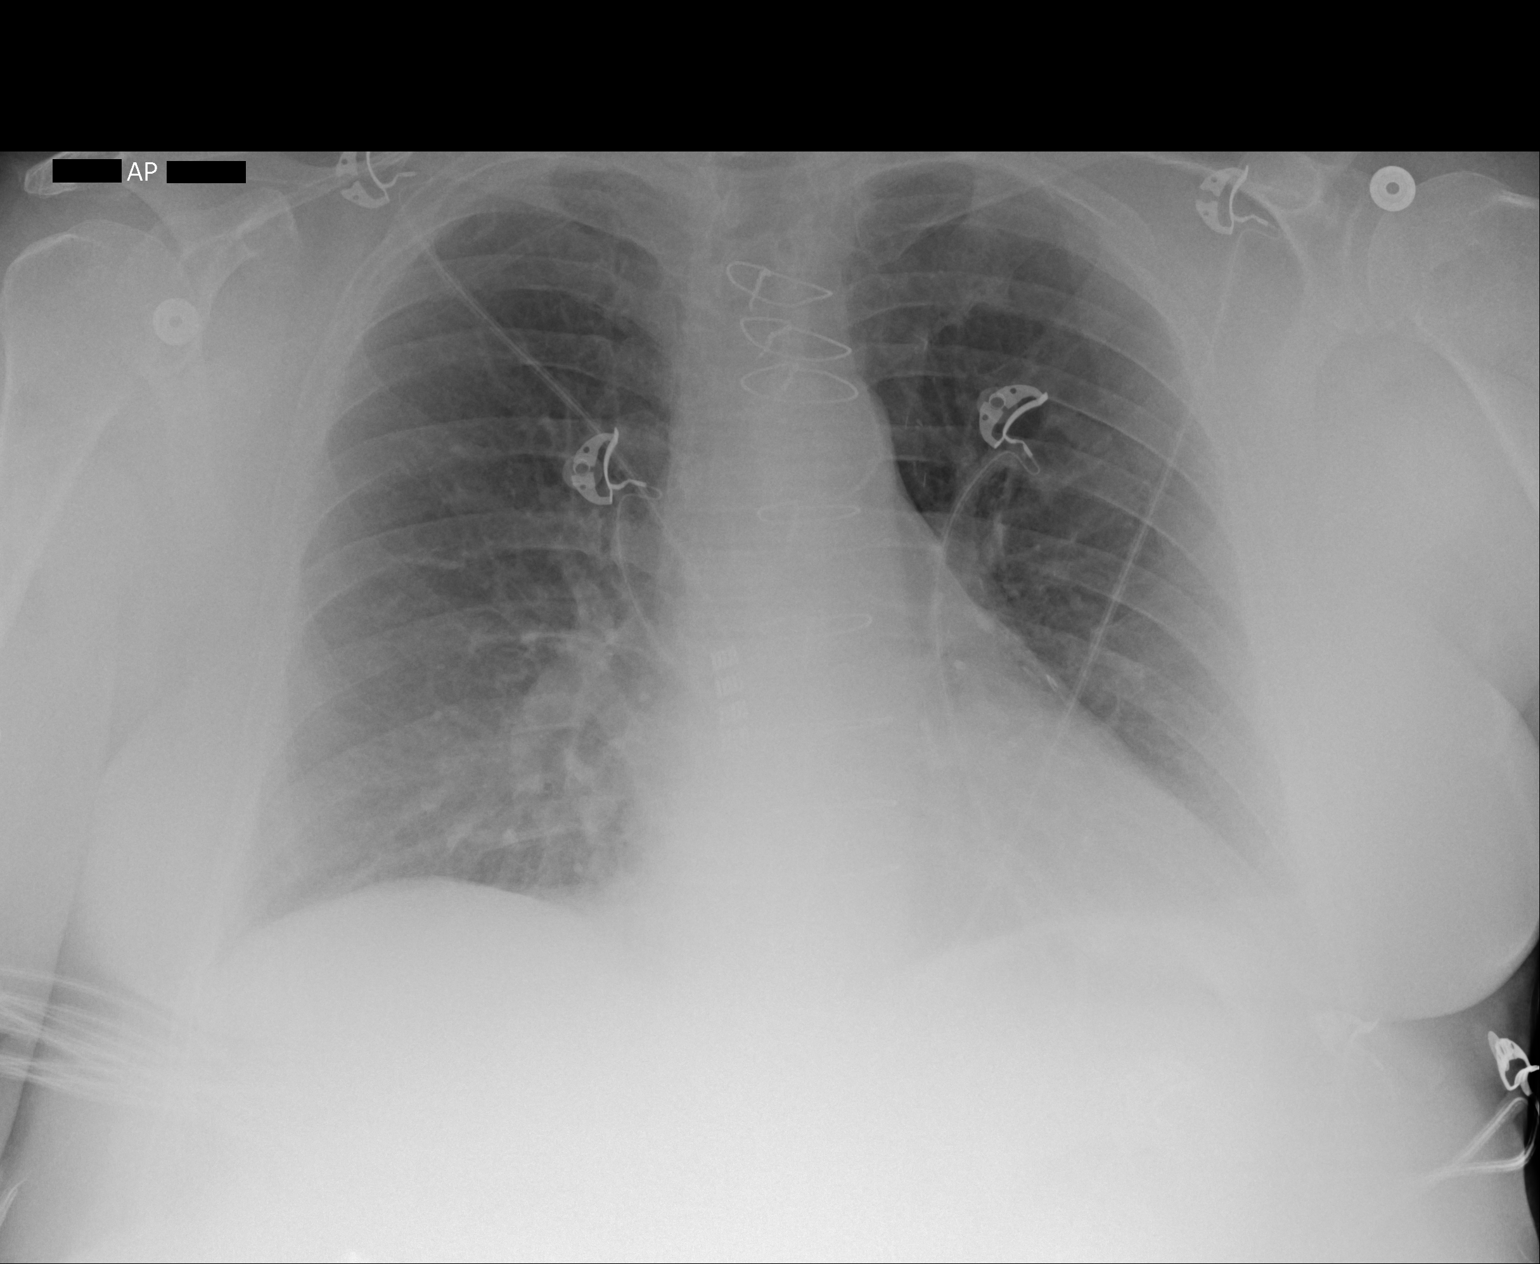

[1 of 1 positions shown; findings below may reference images not displayed]

FINDINGS: There is no edema or consolidation. Heart is borderline enlarged
with pulmonary vascularity within normal limits. There is
atherosclerotic calcification in the aorta. No adenopathy. Patient
is status post median sternotomy. Thoracic stimulator leads in mid
thoracic region. There is postoperative change in the lower cervical
spine region.
IMPRESSION: Aortic atherosclerosis. No edema or consolidation. Heart borderline
enlarged, stable.
